# Patient Record
Sex: Female | Born: 1940 | Race: Black or African American | Hispanic: No | Marital: Single | Smoking: Current some day smoker
Health system: Southern US, Community
[De-identification: ages and names within clinical notes are randomized; demographics above are authoritative.]

## PROBLEM LIST (undated history)

## (undated) DIAGNOSIS — E039 Hypothyroidism, unspecified: Secondary | ICD-10-CM

## (undated) DIAGNOSIS — Z972 Presence of dental prosthetic device (complete) (partial): Secondary | ICD-10-CM

## (undated) DIAGNOSIS — M109 Gout, unspecified: Secondary | ICD-10-CM

## (undated) DIAGNOSIS — K219 Gastro-esophageal reflux disease without esophagitis: Secondary | ICD-10-CM

## (undated) DIAGNOSIS — E785 Hyperlipidemia, unspecified: Secondary | ICD-10-CM

## (undated) DIAGNOSIS — I10 Essential (primary) hypertension: Secondary | ICD-10-CM

## (undated) DIAGNOSIS — M1990 Unspecified osteoarthritis, unspecified site: Secondary | ICD-10-CM

## (undated) DIAGNOSIS — M75102 Unspecified rotator cuff tear or rupture of left shoulder, not specified as traumatic: Secondary | ICD-10-CM

## (undated) HISTORY — PX: CHOLECYSTECTOMY: SHX55

---

## 2004-02-19 ENCOUNTER — Other Ambulatory Visit: Payer: Self-pay

## 2005-04-13 ENCOUNTER — Ambulatory Visit: Payer: Self-pay

## 2006-03-29 ENCOUNTER — Ambulatory Visit: Payer: Self-pay

## 2006-04-04 ENCOUNTER — Emergency Department: Payer: Self-pay | Admitting: Emergency Medicine

## 2006-04-04 ENCOUNTER — Other Ambulatory Visit: Payer: Self-pay

## 2006-04-07 ENCOUNTER — Other Ambulatory Visit: Payer: Self-pay

## 2006-04-07 ENCOUNTER — Emergency Department: Payer: Self-pay | Admitting: Emergency Medicine

## 2007-05-30 ENCOUNTER — Ambulatory Visit: Payer: Self-pay

## 2008-06-25 ENCOUNTER — Ambulatory Visit: Payer: Self-pay

## 2008-09-04 ENCOUNTER — Emergency Department: Payer: Self-pay | Admitting: Emergency Medicine

## 2009-06-30 ENCOUNTER — Ambulatory Visit: Payer: Self-pay | Admitting: Internal Medicine

## 2010-01-06 ENCOUNTER — Ambulatory Visit: Payer: Self-pay | Admitting: Family Medicine

## 2011-08-10 ENCOUNTER — Ambulatory Visit: Payer: Self-pay | Admitting: Family Medicine

## 2012-08-15 ENCOUNTER — Ambulatory Visit: Payer: Self-pay | Admitting: Family Medicine

## 2012-10-21 ENCOUNTER — Emergency Department: Payer: Self-pay | Admitting: Emergency Medicine

## 2012-10-21 LAB — BASIC METABOLIC PANEL
BUN: 25 mg/dL — ABNORMAL HIGH (ref 7–18)
Calcium, Total: 9 mg/dL (ref 8.5–10.1)
Chloride: 104 mmol/L (ref 98–107)
Creatinine: 1 mg/dL (ref 0.60–1.30)
EGFR (Non-African Amer.): 57 — ABNORMAL LOW
Potassium: 3.2 mmol/L — ABNORMAL LOW (ref 3.5–5.1)
Sodium: 139 mmol/L (ref 136–145)

## 2012-10-21 LAB — TROPONIN I
Troponin-I: <0.02 ng/mL
Troponin-I: <0.02 ng/mL

## 2012-10-21 LAB — CBC
HCT: 37.7 % (ref 35.0–47.0)
HGB: 12.6 g/dL (ref 12.0–16.0)
MCH: 31.1 pg (ref 26.0–34.0)
MCHC: 33.5 g/dL (ref 32.0–36.0)
MCV: 93 fL (ref 80–100)
Platelet: 245 10*3/uL (ref 150–440)
RDW: 13.8 % (ref 11.5–14.5)

## 2013-08-16 ENCOUNTER — Ambulatory Visit: Payer: Self-pay | Admitting: Family Medicine

## 2013-12-17 ENCOUNTER — Emergency Department: Payer: Self-pay | Admitting: Emergency Medicine

## 2014-01-14 ENCOUNTER — Emergency Department: Payer: Self-pay | Admitting: Emergency Medicine

## 2014-04-25 ENCOUNTER — Ambulatory Visit: Payer: Self-pay | Admitting: Unknown Physician Specialty

## 2014-05-06 ENCOUNTER — Ambulatory Visit: Payer: Self-pay | Admitting: Family Medicine

## 2014-05-06 ENCOUNTER — Ambulatory Visit: Payer: Self-pay | Admitting: Anesthesiology

## 2014-05-09 ENCOUNTER — Ambulatory Visit: Payer: Self-pay | Admitting: Unknown Physician Specialty

## 2014-05-09 HISTORY — PX: KNEE SURGERY: SHX244

## 2014-08-29 ENCOUNTER — Ambulatory Visit: Payer: Self-pay | Admitting: Internal Medicine

## 2015-08-13 ENCOUNTER — Other Ambulatory Visit: Payer: Self-pay | Admitting: Internal Medicine

## 2015-08-13 DIAGNOSIS — Z1231 Encounter for screening mammogram for malignant neoplasm of breast: Secondary | ICD-10-CM

## 2015-08-31 ENCOUNTER — Ambulatory Visit
Admission: RE | Admit: 2015-08-31 | Discharge: 2015-08-31 | Disposition: A | Discharge status: Home / Self Care | Payer: Medicare Other | Source: Ambulatory Visit | Attending: Internal Medicine | Admitting: Internal Medicine

## 2015-08-31 ENCOUNTER — Other Ambulatory Visit: Payer: Self-pay | Admitting: Internal Medicine

## 2015-08-31 DIAGNOSIS — Z1231 Encounter for screening mammogram for malignant neoplasm of breast: Secondary | ICD-10-CM | POA: Diagnosis not present

## 2016-05-11 ENCOUNTER — Other Ambulatory Visit: Payer: Self-pay | Admitting: Unknown Physician Specialty

## 2016-05-11 ENCOUNTER — Ambulatory Visit
Admission: RE | Admit: 2016-05-11 | Discharge: 2016-05-11 | Disposition: A | Discharge status: Home / Self Care | Payer: Medicare Other | Source: Ambulatory Visit | Attending: Unknown Physician Specialty | Admitting: Unknown Physician Specialty

## 2016-05-11 DIAGNOSIS — R9082 White matter disease, unspecified: Secondary | ICD-10-CM | POA: Insufficient documentation

## 2016-05-11 DIAGNOSIS — H7092 Unspecified mastoiditis, left ear: Secondary | ICD-10-CM | POA: Insufficient documentation

## 2016-05-11 DIAGNOSIS — R51 Headache: Secondary | ICD-10-CM | POA: Insufficient documentation

## 2016-05-11 DIAGNOSIS — G8929 Other chronic pain: Secondary | ICD-10-CM

## 2016-05-11 LAB — POCT I-STAT CREATININE: Creatinine, Ser: 1.3 mg/dL — ABNORMAL HIGH (ref 0.44–1.00)

## 2016-05-11 MED ORDER — GADOBENATE DIMEGLUMINE 529 MG/ML IV SOLN
20.0000 mL | Freq: Once | INTRAVENOUS | Status: AC | PRN
  Administered 2016-05-11: 19 mL via INTRAVENOUS

## 2016-09-01 ENCOUNTER — Other Ambulatory Visit: Payer: Self-pay | Admitting: Family Medicine

## 2016-09-01 DIAGNOSIS — Z1231 Encounter for screening mammogram for malignant neoplasm of breast: Secondary | ICD-10-CM

## 2016-09-27 ENCOUNTER — Other Ambulatory Visit: Payer: Self-pay | Admitting: Family Medicine

## 2016-09-27 ENCOUNTER — Ambulatory Visit
Admission: RE | Admit: 2016-09-27 | Discharge: 2016-09-27 | Disposition: A | Discharge status: Home / Self Care | Payer: Medicare Other | Source: Ambulatory Visit | Attending: Family Medicine | Admitting: Family Medicine

## 2016-09-27 DIAGNOSIS — Z1231 Encounter for screening mammogram for malignant neoplasm of breast: Secondary | ICD-10-CM | POA: Insufficient documentation

## 2017-09-04 ENCOUNTER — Encounter: Payer: Self-pay | Admitting: *Deleted

## 2017-09-11 NOTE — Discharge Instructions (Signed)

## 2017-09-12 ENCOUNTER — Ambulatory Visit
Admission: RE | Admit: 2017-09-12 | Discharge: 2017-09-12 | Disposition: A | Discharge status: Home / Self Care | Payer: Medicare Other | Source: Ambulatory Visit | Attending: Ophthalmology | Admitting: Ophthalmology

## 2017-09-12 ENCOUNTER — Ambulatory Visit: Payer: Medicare Other | Admitting: Anesthesiology

## 2017-09-12 ENCOUNTER — Encounter
Admission: RE | Disposition: A | Discharge status: Home / Self Care | Payer: Self-pay | Source: Ambulatory Visit | Attending: Ophthalmology

## 2017-09-12 DIAGNOSIS — I10 Essential (primary) hypertension: Secondary | ICD-10-CM | POA: Insufficient documentation

## 2017-09-12 DIAGNOSIS — K219 Gastro-esophageal reflux disease without esophagitis: Secondary | ICD-10-CM | POA: Diagnosis not present

## 2017-09-12 DIAGNOSIS — Z6836 Body mass index (BMI) 36.0-36.9, adult: Secondary | ICD-10-CM | POA: Insufficient documentation

## 2017-09-12 DIAGNOSIS — H2512 Age-related nuclear cataract, left eye: Secondary | ICD-10-CM | POA: Insufficient documentation

## 2017-09-12 DIAGNOSIS — E039 Hypothyroidism, unspecified: Secondary | ICD-10-CM | POA: Insufficient documentation

## 2017-09-12 DIAGNOSIS — Z87891 Personal history of nicotine dependence: Secondary | ICD-10-CM | POA: Diagnosis not present

## 2017-09-12 HISTORY — DX: Presence of dental prosthetic device (complete) (partial): Z97.2

## 2017-09-12 HISTORY — DX: Essential (primary) hypertension: I10

## 2017-09-12 HISTORY — DX: Hyperlipidemia, unspecified: E78.5

## 2017-09-12 HISTORY — DX: Gastro-esophageal reflux disease without esophagitis: K21.9

## 2017-09-12 HISTORY — DX: Unspecified rotator cuff tear or rupture of left shoulder, not specified as traumatic: M75.102

## 2017-09-12 HISTORY — DX: Gout, unspecified: M10.9

## 2017-09-12 HISTORY — PX: CATARACT EXTRACTION W/PHACO: SHX586

## 2017-09-12 HISTORY — DX: Unspecified osteoarthritis, unspecified site: M19.90

## 2017-09-12 HISTORY — DX: Hypothyroidism, unspecified: E03.9

## 2017-09-12 SURGERY — PHACOEMULSIFICATION, CATARACT, WITH IOL INSERTION
Anesthesia: Monitor Anesthesia Care | Laterality: Left | Wound class: Clean

## 2017-09-12 MED ORDER — ACETAMINOPHEN 325 MG PO TABS: 325.0000 mg | ORAL_TABLET | ORAL | Status: DC | PRN

## 2017-09-12 MED ORDER — FENTANYL CITRATE (PF) 100 MCG/2ML IJ SOLN
INTRAMUSCULAR | Status: DC | PRN
  Administered 2017-09-12: 50 ug via INTRAVENOUS

## 2017-09-12 MED ORDER — LACTATED RINGERS IV SOLN: 10.0000 mL/h | INTRAVENOUS | Status: DC

## 2017-09-12 MED ORDER — ACETAMINOPHEN 160 MG/5ML PO SOLN: 325.0000 mg | Freq: Once | ORAL | Status: DC

## 2017-09-12 MED ORDER — LIDOCAINE HCL (PF) 2 % IJ SOLN
INTRAMUSCULAR | Status: DC | PRN
  Administered 2017-09-12: 1 mL via INTRAOCULAR

## 2017-09-12 MED ORDER — ARMC OPHTHALMIC DILATING DROPS
1.0000 "application " | OPHTHALMIC | Status: DC | PRN
  Administered 2017-09-12 (×3): 1 via OPHTHALMIC

## 2017-09-12 MED ORDER — MOXIFLOXACIN HCL 0.5 % OP SOLN
OPHTHALMIC | Status: DC | PRN
  Administered 2017-09-12: 0.2 mL via OPHTHALMIC

## 2017-09-12 MED ORDER — LACTATED RINGERS IV SOLN: INTRAVENOUS | Status: DC

## 2017-09-12 MED ORDER — EPINEPHRINE PF 1 MG/ML IJ SOLN
INTRAMUSCULAR | Status: DC | PRN
  Administered 2017-09-12: 75 mL via OPHTHALMIC

## 2017-09-12 MED ORDER — SODIUM HYALURONATE 10 MG/ML IO SOLN
INTRAOCULAR | Status: DC | PRN
  Administered 2017-09-12: 0.55 mL via INTRAOCULAR

## 2017-09-12 MED ORDER — ONDANSETRON HCL 4 MG/2ML IJ SOLN: 4.0000 mg | Freq: Once | INTRAMUSCULAR | Status: DC | PRN

## 2017-09-12 MED ORDER — ACETAMINOPHEN 325 MG PO TABS: 325.0000 mg | ORAL_TABLET | Freq: Once | ORAL | Status: DC

## 2017-09-12 MED ORDER — MIDAZOLAM HCL 2 MG/2ML IJ SOLN
INTRAMUSCULAR | Status: DC | PRN
  Administered 2017-09-12: 1.5 mg via INTRAVENOUS
  Administered 2017-09-12: 0.5 mg via INTRAVENOUS

## 2017-09-12 MED ORDER — ACETAMINOPHEN 160 MG/5ML PO SOLN: 325.0000 mg | ORAL | Status: DC | PRN

## 2017-09-12 MED ORDER — SODIUM HYALURONATE 23 MG/ML IO SOLN
INTRAOCULAR | Status: DC | PRN
  Administered 2017-09-12: 0.6 mL via INTRAOCULAR

## 2017-09-12 SURGICAL SUPPLY — 16 items

## 2017-09-12 NOTE — Op Note (Signed)
OPERATIVE NOTE  Shirley Knight [id number] 09/12/2017   PREOPERATIVE DIAGNOSIS:  Nuclear sclerotic cataract left eye.  H25.12   POSTOPERATIVE DIAGNOSIS:    Nuclear sclerotic cataract left eye.     PROCEDURE:  Phacoemusification with posterior chamber intraocular lens placement of the left eye   LENS:   Implant Name Type Inv. Item Serial No. Manufacturer Lot No. LRB No. Used  LENS IOL DIOP 22.0 - [id number] Intraocular Lens LENS IOL DIOP 22.0 [id number]   Left 1       PCB00 +22.0   ULTRASOUND TIME: 0 minutes 33 seconds.  CDE 4.28   SURGEON:  Willey BladeBradley King, MD, MPH   ANESTHESIA:  Topical with tetracaine drops augmented with 1% preservative-free intracameral lidocaine.  ESTIMATED BLOOD LOSS: <1 mL   COMPLICATIONS:  None.   DESCRIPTION OF PROCEDURE:  The patient was identified in the holding room and transported to the operating room and placed in the supine position under the operating microscope.  The left eye was identified as the operative eye and it was prepped and draped in the usual sterile ophthalmic fashion.   A 1.0 millimeter clear-corneal paracentesis was made at the 5:00 position. 0.5 ml of preservative-free 1% lidocaine with epinephrine was injected into the anterior chamber.  The anterior chamber was filled with Healon 5 viscoelastic.  A 2.4 millimeter keratome was used to make a near-clear corneal incision at the 2:00 position.  A curvilinear capsulorrhexis was made with a cystotome and capsulorrhexis forceps.  Balanced salt solution was used to hydrodissect and hydrodelineate the nucleus.   Phacoemulsification was then used in stop and chop fashion to remove the lens nucleus and epinucleus.  The remaining cortex was then removed using the irrigation and aspiration handpiece. Healon was then placed into the capsular bag to distend it for lens placement.  A lens was then injected into the capsular bag.  The remaining viscoelastic was aspirated.   Wounds were hydrated  with balanced salt solution.  The anterior chamber was inflated to a physiologic pressure with balanced salt solution.  Intracameral vigamox 0.1 mL undiltued was injected into the eye and a drop placed onto the ocular surface.  No wound leaks were noted.  The patient was taken to the recovery room in stable condition without complications of anesthesia or surgery  Willey BladeBradley King 09/12/2017, 9:19 AM

## 2017-09-12 NOTE — Anesthesia Preprocedure Evaluation (Signed)
Anesthesia Evaluation  Patient identified by MRN, date of birth, ID band Patient awake    Reviewed: Allergy & Precautions, H&P , NPO status , Patient's Chart, lab work & pertinent test results  Airway Mallampati: II  TM Distance: >3 FB Neck ROM: full    Dental no notable dental hx.    Pulmonary former smoker,    Pulmonary exam normal breath sounds clear to auscultation       Cardiovascular hypertension, Normal cardiovascular exam Rhythm:regular Rate:Normal     Neuro/Psych    GI/Hepatic GERD  ,  Endo/Other  Hypothyroidism Morbid obesity  Renal/GU      Musculoskeletal   Abdominal   Peds  Hematology   Anesthesia Other Findings   Reproductive/Obstetrics                             Anesthesia Physical Anesthesia Plan  ASA: II  Anesthesia Plan: MAC   Post-op Pain Management:    Induction:   PONV Risk Score and Plan: 2 and Midazolam  Airway Management Planned:   Additional Equipment:   Intra-op Plan:   Post-operative Plan:   Informed Consent: I have reviewed the patients History and Physical, chart, labs and discussed the procedure including the risks, benefits and alternatives for the proposed anesthesia with the patient or authorized representative who has indicated his/her understanding and acceptance.     Plan Discussed with: CRNA  Anesthesia Plan Comments:         Anesthesia Quick Evaluation

## 2017-09-12 NOTE — H&P (Signed)
The History and Physical notes are on paper, have been signed, and are to be scanned.   I have examined the patient and there are no changes to the H&P.   Willey BladeBradley Kendrea Cerritos 09/12/2017 8:43 AM

## 2017-09-12 NOTE — Transfer of Care (Signed)
Immediate Anesthesia Transfer of Care Note  Patient: Shirley Knight  Procedure(s) Performed: CATARACT EXTRACTION PHACO AND INTRAOCULAR LENS PLACEMENT (IOC)  LEFT (Left )  Patient Location: PACU  Anesthesia Type: MAC  Level of Consciousness: awake, alert  and patient cooperative  Airway and Oxygen Therapy: Patient Spontanous Breathing and Patient connected to supplemental oxygen  Post-op Assessment: Post-op Vital signs reviewed, Patient's Cardiovascular Status Stable, Respiratory Function Stable, Patent Airway and No signs of Nausea or vomiting  Post-op Vital Signs: Reviewed and stable  Complications: No apparent anesthesia complications

## 2017-09-12 NOTE — Anesthesia Postprocedure Evaluation (Signed)
Anesthesia Post Note  Patient: Shirley Knight  Procedure(s) Performed: CATARACT EXTRACTION PHACO AND INTRAOCULAR LENS PLACEMENT (IOC)  LEFT (Left )  Patient location during evaluation: PACU Anesthesia Type: MAC Level of consciousness: awake and alert and oriented Pain management: satisfactory to patient Vital Signs Assessment: post-procedure vital signs reviewed and stable Respiratory status: spontaneous breathing, nonlabored ventilation and respiratory function stable Cardiovascular status: blood pressure returned to baseline and stable Postop Assessment: Adequate PO intake and No signs of nausea or vomiting Anesthetic complications: no    Raliegh Ip

## 2017-09-12 NOTE — Anesthesia Procedure Notes (Signed)
Procedure Name: MAC Performed by: Ellenora Talton Pre-anesthesia Checklist: Patient identified, Emergency Drugs available, Suction available, Timeout performed and Patient being monitored Patient Re-evaluated:Patient Re-evaluated prior to inductionOxygen Delivery Method: Nasal cannula Placement Confirmation: positive ETCO2       

## 2017-09-13 ENCOUNTER — Encounter: Payer: Self-pay | Admitting: Ophthalmology

## 2017-09-18 ENCOUNTER — Emergency Department
Admission: EM | Admit: 2017-09-18 | Discharge: 2017-09-18 | Disposition: A | Discharge status: Home / Self Care | Payer: Medicare Other | Attending: Emergency Medicine | Admitting: Emergency Medicine

## 2017-09-18 ENCOUNTER — Encounter: Payer: Self-pay | Admitting: *Deleted

## 2017-09-18 ENCOUNTER — Emergency Department: Payer: Medicare Other

## 2017-09-18 DIAGNOSIS — Z79899 Other long term (current) drug therapy: Secondary | ICD-10-CM | POA: Diagnosis not present

## 2017-09-18 DIAGNOSIS — E039 Hypothyroidism, unspecified: Secondary | ICD-10-CM | POA: Diagnosis not present

## 2017-09-18 DIAGNOSIS — R42 Dizziness and giddiness: Secondary | ICD-10-CM | POA: Diagnosis present

## 2017-09-18 DIAGNOSIS — I10 Essential (primary) hypertension: Secondary | ICD-10-CM | POA: Diagnosis not present

## 2017-09-18 DIAGNOSIS — Z87891 Personal history of nicotine dependence: Secondary | ICD-10-CM | POA: Insufficient documentation

## 2017-09-18 LAB — URINALYSIS, COMPLETE (UACMP) WITH MICROSCOPIC
BILIRUBIN URINE: NEGATIVE
Glucose, UA: NEGATIVE mg/dL
HGB URINE DIPSTICK: NEGATIVE
KETONES UR: NEGATIVE mg/dL
Nitrite: NEGATIVE
Protein, ur: NEGATIVE mg/dL
RBC / HPF: NONE SEEN RBC/hpf (ref 0–5)
SPECIFIC GRAVITY, URINE: 1.016 (ref 1.005–1.030)
pH: 7 (ref 5.0–8.0)

## 2017-09-18 LAB — HEPATIC FUNCTION PANEL
ALT: 15 U/L (ref 14–54)
AST: 23 U/L (ref 15–41)
Albumin: 4 g/dL (ref 3.5–5.0)
Alkaline Phosphatase: 101 U/L (ref 38–126)
Total Bilirubin: 0.5 mg/dL (ref 0.3–1.2)
Total Protein: 7.8 g/dL (ref 6.5–8.1)

## 2017-09-18 LAB — BASIC METABOLIC PANEL
ANION GAP: 10 (ref 5–15)
BUN: 27 mg/dL — ABNORMAL HIGH (ref 6–20)
CHLORIDE: 106 mmol/L (ref 101–111)
CO2: 23 mmol/L (ref 22–32)
Calcium: 9.6 mg/dL (ref 8.9–10.3)
Creatinine, Ser: 1.42 mg/dL — ABNORMAL HIGH (ref 0.44–1.00)
GFR calc non Af Amer: 35 mL/min — ABNORMAL LOW (ref >60)
GFR, EST AFRICAN AMERICAN: 41 mL/min — AB (ref >60)
Glucose, Bld: 97 mg/dL (ref 65–99)
POTASSIUM: 3.7 mmol/L (ref 3.5–5.1)
SODIUM: 139 mmol/L (ref 135–145)

## 2017-09-18 LAB — CBC
HEMATOCRIT: 37.7 % (ref 35.0–47.0)
HEMOGLOBIN: 12.7 g/dL (ref 12.0–16.0)
MCH: 31.9 pg (ref 26.0–34.0)
MCHC: 33.6 g/dL (ref 32.0–36.0)
MCV: 94.8 fL (ref 80.0–100.0)
PLATELETS: 171 10*3/uL (ref 150–440)
RBC: 3.98 MIL/uL (ref 3.80–5.20)
RDW: 14.7 % — ABNORMAL HIGH (ref 11.5–14.5)
WBC: 7.8 10*3/uL (ref 3.6–11.0)

## 2017-09-18 LAB — TROPONIN I

## 2017-09-18 NOTE — Discharge Instructions (Addendum)
I think you are having a reaction on the amlodipine. For now I would cut the amlodipine in half and take half pill in the evening. I would call your doctor up near leave here and tell them that even though your now taking the medication in the evening and still feeling bad .  please have your doctor continue to monitor your renal/kidney function.

## 2017-09-18 NOTE — ED Provider Notes (Signed)
[facility] Emergency Department Provider Note   ____________________________________________   First MD Initiated Contact with Patient 09/18/17 [phone]     (approximate)  I have reviewed the triage vital signs and the nursing notes.   HISTORY  Chief Complaint Dizziness    HPI Jeanene ErbDonna W Yochim is a 76 y.o. female Who reports she was started on amlodipine about 3 weeks ago. She's been feeling weak and dizzy ever since then. She went to see her doctor on Friday and was told that she is taking it wrong she needs to take it in the evening instead of in the morning. She has been taking it in the evening since Friday but feeling even worse now. She reports nothing else is new or different she is not coughing or sneezing or having any aches or pains anywhere.  *} Past Medical History:  Diagnosis Date  . Arthritis   . GERD (gastroesophageal reflux disease)   . Gout   . Hyperlipidemia   . Hypertension   . Hypothyroidism   . Rotator cuff tear, left   . Wears dentures    partial upper    There are no active problems to display for this patient.   Past Surgical History:  Procedure Laterality Date  . CATARACT EXTRACTION W/PHACO Left 09/12/2017   Procedure: CATARACT EXTRACTION PHACO AND INTRAOCULAR LENS PLACEMENT (IOC)  LEFT;  Surgeon: Nevada CraneKing, Bradley Mark, MD;  [facility];  Service: Ophthalmology;  Laterality: Left;  . CHOLECYSTECTOMY    . KNEE SURGERY Left 05/09/2014   [facility]fred[facility]    Prior to Admission medications   Medication Sig Start Date End Date Taking? Authorizing Provider  allopurinol (ZYLOPRIM) 100 MG tablet Take 100 mg by mouth daily.    [provider]  amLODipine (NORVASC) 5 MG tablet Take 5 mg by mouth daily.    [provider]  aspirin 81 MG tablet Take 81 mg by mouth daily.    [provider]  atorvastatin (LIPITOR) 10 MG tablet Take 10 mg by mouth daily.    [provider]    CALCIUM PO Take by mouth daily.    [provider]  Cholecalciferol (VITAMIN D3) 2000 units TABS Take by mouth daily.    [provider]  Eluxadoline 100 MG TABS Take by mouth daily.    [provider]  HYDROcodone-acetaminophen (NORCO/VICODIN) 5-325 MG tablet Take 1 tablet by mouth 2 (two) times daily as needed for moderate pain.    [provider]  levothyroxine (SYNTHROID, LEVOTHROID) 88 MCG tablet Take 88 mcg by mouth daily before breakfast.    [provider]  meloxicam (MOBIC) 15 MG tablet Take 15 mg by mouth daily as needed for pain.    [provider]  metoprolol-hydrochlorothiazide (LOPRESSOR HCT) 100-25 MG tablet Take 1 tablet by mouth daily.    [provider]  Omega-3 Fatty Acids (FISH OIL) 1000 MG CPDR Take by mouth daily.    [provider]  omeprazole (PRILOSEC) 40 MG capsule Take 40 mg by mouth daily.    [provider]  traMADol (ULTRAM) 50 MG tablet Take by mouth every 6 (six) hours as needed.    [provider]    Allergies Penicillins  Family History  Problem Relation Age of Onset  . Breast cancer Mother 1950    Social History Social History  Substance Use Topics  . Smoking status: Former Smoker    Quit date: 08/2016  . Smokeless  tobacco: Never Used  . Alcohol use 1.8 oz/week    3 Shots of liquor per week    Review of Systems  Constitutional: No fever/chills Eyes: No visual changes. ENT: No sore throat. Cardiovascular: Denies chest pain. Respiratory: Denies shortness of breath. Gastrointestinal: No abdominal pain.  No nausea, no vomiting.  No diarrhea.  No constipation. Genitourinary: Negative for dysuria. Musculoskeletal: Negative for back pain. Skin: Negative for rash. Neurological: Negative for headaches, focal weakness  ____________________________________________   PHYSICAL EXAM:  VITAL SIGNS: ED Triage Vitals [09/18/17 0504]  Enc Vitals Group     BP (!)  144/76     Pulse Rate 69     Resp 20     Temp 98.7 F (37.1 C)     Temp Source Oral     SpO2 100 %     Weight      Height      Head Circumference      Peak Flow      Pain Score      Pain Loc      Pain Edu?      Excl. in GC?     Constitutional: Alert and oriented. Well appearing and in no acute distress. Eyes: Conjunctivae are normal. PERRL. EOMI. Head: Atraumatic. Nose: No congestion/rhinnorhea. Mouth/Throat: Mucous membranes are moist.  Oropharynx non-erythematous. Neck: No stridor. Cardiovascular: Normal rate, regular rhythm. Grossly normal heart sounds.  Good peripheral circulation. Respiratory: Normal respiratory effort.  No retractions. Lungs CTAB. Gastrointestinal: Soft and nontender. No distention. No abdominal bruits. No CVA tenderness. Musculoskeletal: No lower extremity tenderness nor edema.  No joint effusions. Neurologic:  Normal speech and language. No gross focal neurologic deficits are appreciated. specifically cranial nerves II through XII are intact of the visual fields were not checked vision cerebellar finger-nose rapid alternating movements and hands are normal motor strength is 5 over 5 and sensation is intact throughout Skin:  Skin is warm, dry and intact. No rash noted. Psychiatric: Mood and affect are normal. Speech and behavior are normal.  ____________________________________________   LABS (all labs ordered are listed, but only abnormal results are displayed)  Labs Reviewed  BASIC METABOLIC PANEL - Abnormal; Notable for the following:       Result Value   BUN 27 (*)    Creatinine, Ser 1.42 (*)    GFR calc non Af Amer 35 (*)    GFR calc Af Amer 41 (*)    All other components within normal limits  CBC - Abnormal; Notable for the following:    RDW 14.7 (*)    All other components within normal limits  URINALYSIS, COMPLETE (UACMP) WITH MICROSCOPIC - Abnormal; Notable for the following:    Color, Urine YELLOW (*)    APPearance CLEAR (*)     Leukocytes, UA TRACE (*)    Bacteria, UA RARE (*)    Squamous Epithelial / LPF 0-5 (*)    All other components within normal limits  HEPATIC FUNCTION PANEL - Abnormal; Notable for the following:    Bilirubin, Direct <0.1 (*)    All other components within normal limits  TROPONIN I   ____________________________________________  EKG  EKG read and interpreted by me shows normal sinus rhythm rate of 63 left axis decreased R-wave progression ____________________________________________  RADIOLOGY  chest x-ray shows no acute disease ____________________________________________   PROCEDURES  Procedure(s) performed:   Procedures  Critical Care performed:   ____________________________________________   INITIAL IMPRESSION / ASSESSMENT AND PLAN / ED COURSE  As part  of my medical decision making, I reviewed the following data within the electronic MEDICAL RECORD NUMBER records were reviewed patient's GFR is just slightly lower than it was last time. She says she is not feeling really hungry and not eating as much because she feels bad again this is all started immediately after she started the new medication her electrolytes are otherwise essentially normal her white blood count was normal troponins negative chest x-ray looks good. Exam is also normal history sounds most consistent with a reaction to the amlodipine. I will have her follow-up with her regular doctor. Her doctor will have to watch her renal function      ____________________________________________   FINAL CLINICAL IMPRESSION(S) / ED DIAGNOSES  Final diagnoses:  Dizziness      NEW MEDICATIONS STARTED DURING THIS VISIT:  New Prescriptions   No medications on file     Note:  This document was prepared using Dragon voice recognition software and may include unintentional dictation errors.    Arnaldo Natal, MD 09/18/17 1115

## 2017-09-18 NOTE — ED Notes (Signed)
AAOx3.  Skin warm and dry.  NAD 

## 2017-09-18 NOTE — ED Triage Notes (Addendum)
Patient reports recent change in blood pressure medication added a medication in the evening along with usual blood pressure medication..  States taking metoprolol with hctz in the mornings (old medicine) and added norvasc in the evenings (new)  Reports having dizziness and "feeling terrible".

## 2017-10-02 NOTE — Discharge Instructions (Signed)

## 2017-10-03 ENCOUNTER — Encounter
Admission: RE | Disposition: A | Discharge status: Home / Self Care | Payer: Self-pay | Source: Ambulatory Visit | Attending: Ophthalmology

## 2017-10-03 ENCOUNTER — Ambulatory Visit: Payer: Medicare Other | Admitting: Anesthesiology

## 2017-10-03 ENCOUNTER — Ambulatory Visit
Admission: RE | Admit: 2017-10-03 | Discharge: 2017-10-03 | Disposition: A | Discharge status: Home / Self Care | Payer: Medicare Other | Source: Ambulatory Visit | Attending: Ophthalmology | Admitting: Ophthalmology

## 2017-10-03 DIAGNOSIS — Z87891 Personal history of nicotine dependence: Secondary | ICD-10-CM | POA: Insufficient documentation

## 2017-10-03 DIAGNOSIS — I10 Essential (primary) hypertension: Secondary | ICD-10-CM | POA: Diagnosis not present

## 2017-10-03 DIAGNOSIS — H2511 Age-related nuclear cataract, right eye: Secondary | ICD-10-CM | POA: Diagnosis present

## 2017-10-03 DIAGNOSIS — K219 Gastro-esophageal reflux disease without esophagitis: Secondary | ICD-10-CM | POA: Diagnosis not present

## 2017-10-03 HISTORY — PX: CATARACT EXTRACTION W/PHACO: SHX586

## 2017-10-03 SURGERY — PHACOEMULSIFICATION, CATARACT, WITH IOL INSERTION
Anesthesia: Monitor Anesthesia Care | Laterality: Right | Wound class: Clean

## 2017-10-03 MED ORDER — MEPERIDINE HCL 25 MG/ML IJ SOLN: 6.2500 mg | INTRAMUSCULAR | Status: DC | PRN

## 2017-10-03 MED ORDER — ARMC OPHTHALMIC DILATING DROPS
1.0000 "application " | OPHTHALMIC | Status: DC | PRN
  Administered 2017-10-03 (×3): 1 via OPHTHALMIC

## 2017-10-03 MED ORDER — FENTANYL CITRATE (PF) 100 MCG/2ML IJ SOLN: 25.0000 ug | INTRAMUSCULAR | Status: DC | PRN

## 2017-10-03 MED ORDER — SODIUM HYALURONATE 10 MG/ML IO SOLN
INTRAOCULAR | Status: DC | PRN
  Administered 2017-10-03: 0.85 mL via INTRAOCULAR

## 2017-10-03 MED ORDER — OXYCODONE HCL 5 MG/5ML PO SOLN: 5.0000 mg | Freq: Once | ORAL | Status: DC | PRN

## 2017-10-03 MED ORDER — LACTATED RINGERS IV SOLN: 10.0000 mL/h | INTRAVENOUS | Status: DC

## 2017-10-03 MED ORDER — EPINEPHRINE PF 1 MG/ML IJ SOLN
INTRAOCULAR | Status: DC | PRN
  Administered 2017-10-03: 88 mL via OPHTHALMIC

## 2017-10-03 MED ORDER — MIDAZOLAM HCL 2 MG/2ML IJ SOLN
INTRAMUSCULAR | Status: DC | PRN
  Administered 2017-10-03: 2 mg via INTRAVENOUS

## 2017-10-03 MED ORDER — OXYCODONE HCL 5 MG PO TABS: 5.0000 mg | ORAL_TABLET | Freq: Once | ORAL | Status: DC | PRN

## 2017-10-03 MED ORDER — MOXIFLOXACIN HCL 0.5 % OP SOLN
OPHTHALMIC | Status: DC | PRN
  Administered 2017-10-03: 1 [drp] via OPHTHALMIC

## 2017-10-03 MED ORDER — FENTANYL CITRATE (PF) 100 MCG/2ML IJ SOLN
INTRAMUSCULAR | Status: DC | PRN
  Administered 2017-10-03: 50 ug via INTRAVENOUS

## 2017-10-03 MED ORDER — PROMETHAZINE HCL 25 MG/ML IJ SOLN: 6.2500 mg | INTRAMUSCULAR | Status: DC | PRN

## 2017-10-03 MED ORDER — LIDOCAINE HCL (PF) 2 % IJ SOLN
INTRAMUSCULAR | Status: DC | PRN
  Administered 2017-10-03: 1 mL via INTRAOCULAR

## 2017-10-03 MED ORDER — SODIUM HYALURONATE 23 MG/ML IO SOLN
INTRAOCULAR | Status: DC | PRN
  Administered 2017-10-03: 0.6 mL via INTRAOCULAR

## 2017-10-03 SURGICAL SUPPLY — 16 items
CANNULA ANT/CHMB 27GA (MISCELLANEOUS) ×3 IMPLANT
DISSECTOR HYDRO NUCLEUS 50X22 (MISCELLANEOUS) ×3 IMPLANT
GLOVE BIO SURGEON STRL SZ8 (GLOVE) ×3 IMPLANT
GLOVE SURG LX 7.5 STRW (GLOVE) ×2
GLOVE SURG LX STRL 7.5 STRW (GLOVE) ×1 IMPLANT
GOWN STRL REUS W/ TWL LRG LVL3 (GOWN DISPOSABLE) ×2 IMPLANT
GOWN STRL REUS W/TWL LRG LVL3 (GOWN DISPOSABLE) ×4
LENS IOL TECNIS ITEC 22.5 (Intraocular Lens) ×3 IMPLANT
MARKER SKIN DUAL TIP RULER LAB (MISCELLANEOUS) ×3 IMPLANT
PACK CATARACT (MISCELLANEOUS) ×3 IMPLANT
PACK DR. KING ARMS (PACKS) ×3 IMPLANT
PACK EYE AFTER SURG (MISCELLANEOUS) ×3 IMPLANT
SYR 3ML LL SCALE MARK (SYRINGE) ×3 IMPLANT
SYR TB 1ML LUER SLIP (SYRINGE) ×3 IMPLANT
WATER STERILE IRR 500ML POUR (IV SOLUTION) ×3 IMPLANT
WIPE NON LINTING 3.25X3.25 (MISCELLANEOUS) ×3 IMPLANT

## 2017-10-03 NOTE — Anesthesia Preprocedure Evaluation (Signed)
Anesthesia Evaluation  Patient identified by MRN, date of birth, ID band Patient awake    Reviewed: Allergy & Precautions, H&P , NPO status , Patient's Chart, lab work & pertinent test results  Airway Mallampati: II  TM Distance: >3 FB Neck ROM: full    Dental no notable dental hx.    Pulmonary neg pulmonary ROS, former smoker,    Pulmonary exam normal breath sounds clear to auscultation       Cardiovascular hypertension, negative cardio ROS Normal cardiovascular exam Rhythm:regular Rate:Normal     Neuro/Psych negative neurological ROS  negative psych ROS   GI/Hepatic negative GI ROS, Neg liver ROS, GERD  ,  Endo/Other  negative endocrine ROSHypothyroidism Morbid obesity  Renal/GU negative Renal ROS  negative genitourinary   Musculoskeletal negative musculoskeletal ROS (+)   Abdominal   Peds negative pediatric ROS (+)  Hematology negative hematology ROS (+)   Anesthesia Other Findings   Reproductive/Obstetrics negative OB ROS                             Anesthesia Physical  Anesthesia Plan  ASA: II  Anesthesia Plan: MAC   Post-op Pain Management:    Induction:   PONV Risk Score and Plan: 2 and Midazolam  Airway Management Planned:   Additional Equipment:   Intra-op Plan:   Post-operative Plan:   Informed Consent: I have reviewed the patients History and Physical, chart, labs and discussed the procedure including the risks, benefits and alternatives for the proposed anesthesia with the patient or authorized representative who has indicated his/her understanding and acceptance.     Plan Discussed with: CRNA  Anesthesia Plan Comments:         Anesthesia Quick Evaluation

## 2017-10-03 NOTE — Op Note (Signed)
OPERATIVE NOTE  Shirley Knight [id number] 10/03/2017   PREOPERATIVE DIAGNOSIS:  Nuclear sclerotic cataract right eye.  H25.11   POSTOPERATIVE DIAGNOSIS:    Nuclear sclerotic cataract right eye.     PROCEDURE:  Phacoemusification with posterior chamber intraocular lens placement of the right eye   LENS:   Implant Name Type Inv. Item Serial No. Manufacturer Lot No. LRB No. Used  LENS IOL DIOP 22.5 - [id number] Intraocular Lens LENS IOL DIOP 22.5 [id number] AMO  Right 1       PCB00 +22.5   ULTRASOUND TIME: 0 minutes 50 seconds.  CDE 4.11   SURGEON:  Willey BladeBradley Ethelwyn Gilbertson, MD, MPH  ANESTHESIOLOGIST: Anesthesiologist: Aldine ContesScouras, Nicole Elaine, MD CRNA: Andee PolesBush, Wendy, CRNA   ANESTHESIA:  Topical with tetracaine drops augmented with 1% preservative-free intracameral lidocaine.  ESTIMATED BLOOD LOSS: less than 1 mL.   COMPLICATIONS:  None.   DESCRIPTION OF PROCEDURE:  The patient was identified in the holding room and transported to the operating room and placed in the supine position under the operating microscope.  The right eye was identified as the operative eye and it was prepped and draped in the usual sterile ophthalmic fashion.   A 1.0 millimeter clear-corneal paracentesis was made at the 10:30 position. 0.5 ml of preservative-free 1% lidocaine with epinephrine was injected into the anterior chamber.  The anterior chamber was filled with Healon 5 viscoelastic.  A 2.4 millimeter keratome was used to make a near-clear corneal incision at the 8:00 position.  A curvilinear capsulorrhexis was made with a cystotome and capsulorrhexis forceps.  Balanced salt solution was used to hydrodissect and hydrodelineate the nucleus.   Phacoemulsification was then used in stop and chop fashion to remove the lens nucleus and epinucleus.  The remaining cortex was then removed using the irrigation and aspiration handpiece. Healon was then placed into the capsular bag to distend it for lens placement.  A lens  was then injected into the capsular bag.  The remaining viscoelastic was aspirated.   Wounds were hydrated with balanced salt solution.  The anterior chamber was inflated to a physiologic pressure with balanced salt solution.   The lens was relatively soft, and a portion of the epinucleus was removed with the I/A.  Intracameral vigamox 0.1 mL undiluted was injected into the eye and a drop placed onto the ocular surface.  No wound leaks were noted.  The patient was taken to the recovery room in stable condition without complications of anesthesia or surgery  Willey BladeBradley Othella Slappey 10/03/2017, 7:58 AM

## 2017-10-03 NOTE — Anesthesia Procedure Notes (Signed)
Procedure Name: MAC Performed by: Ahilyn Nell, CRNA Pre-anesthesia Checklist: Patient identified, Emergency Drugs available, Suction available, Timeout performed and Patient being monitored Patient Re-evaluated:Patient Re-evaluated prior to induction Oxygen Delivery Method: Nasal cannula Placement Confirmation: positive ETCO2       

## 2017-10-03 NOTE — Anesthesia Postprocedure Evaluation (Signed)
Anesthesia Post Note  Patient: Shirley Knight  Procedure(s) Performed: CATARACT EXTRACTION PHACO AND INTRAOCULAR LENS PLACEMENT (IOC)  RIGHT (Right )  Patient location during evaluation: PACU Anesthesia Type: MAC Level of consciousness: awake and alert Pain management: pain level controlled Vital Signs Assessment: post-procedure vital signs reviewed and stable Respiratory status: spontaneous breathing, nonlabored ventilation, respiratory function stable and patient connected to nasal cannula oxygen Cardiovascular status: stable and blood pressure returned to baseline Postop Assessment: no apparent nausea or vomiting Anesthetic complications: no    [name] ELAINE

## 2017-10-03 NOTE — H&P (Signed)
The History and Physical notes are on paper, have been signed, and are to be scanned.   I have examined the patient and there are no changes to the H&P.   Willey BladeBradley Alania Overholt 10/03/2017 7:18 AM

## 2017-10-03 NOTE — Transfer of Care (Signed)
Immediate Anesthesia Transfer of Care Note  Patient: Shirley Knight  Procedure(s) Performed: CATARACT EXTRACTION PHACO AND INTRAOCULAR LENS PLACEMENT (IOC)  RIGHT (Right )  Patient Location: PACU  Anesthesia Type: MAC  Level of Consciousness: awake, alert  and patient cooperative  Airway and Oxygen Therapy: Patient Spontanous Breathing and Patient connected to supplemental oxygen  Post-op Assessment: Post-op Vital signs reviewed, Patient's Cardiovascular Status Stable, Respiratory Function Stable, Patent Airway and No signs of Nausea or vomiting  Post-op Vital Signs: Reviewed and stable  Complications: No apparent anesthesia complications

## 2017-10-04 ENCOUNTER — Encounter: Payer: Self-pay | Admitting: Ophthalmology

## 2017-10-25 ENCOUNTER — Other Ambulatory Visit: Payer: Self-pay | Admitting: Family Medicine

## 2017-10-25 DIAGNOSIS — Z1231 Encounter for screening mammogram for malignant neoplasm of breast: Secondary | ICD-10-CM

## 2017-12-07 ENCOUNTER — Ambulatory Visit
Admission: RE | Admit: 2017-12-07 | Discharge: 2017-12-07 | Disposition: A | Discharge status: Home / Self Care | Payer: Medicare Other | Source: Ambulatory Visit | Attending: Family Medicine | Admitting: Family Medicine

## 2017-12-07 DIAGNOSIS — Z1231 Encounter for screening mammogram for malignant neoplasm of breast: Secondary | ICD-10-CM

## 2018-01-10 ENCOUNTER — Ambulatory Visit: Payer: Medicare Other | Attending: Internal Medicine

## 2018-01-10 DIAGNOSIS — G4733 Obstructive sleep apnea (adult) (pediatric): Secondary | ICD-10-CM | POA: Insufficient documentation

## 2018-04-17 ENCOUNTER — Emergency Department: Payer: Medicare Other

## 2018-04-17 ENCOUNTER — Emergency Department
Admission: EM | Admit: 2018-04-17 | Discharge: 2018-04-17 | Disposition: A | Discharge status: Home / Self Care | Payer: Medicare Other | Attending: Student in an Organized Health Care Education/Training Program | Admitting: Student in an Organized Health Care Education/Training Program

## 2018-04-17 ENCOUNTER — Encounter: Payer: Self-pay | Admitting: Emergency Medicine

## 2018-04-17 DIAGNOSIS — M7541 Impingement syndrome of right shoulder: Secondary | ICD-10-CM | POA: Insufficient documentation

## 2018-04-17 DIAGNOSIS — Z79899 Other long term (current) drug therapy: Secondary | ICD-10-CM | POA: Diagnosis not present

## 2018-04-17 DIAGNOSIS — Z87891 Personal history of nicotine dependence: Secondary | ICD-10-CM | POA: Diagnosis not present

## 2018-04-17 DIAGNOSIS — E039 Hypothyroidism, unspecified: Secondary | ICD-10-CM | POA: Diagnosis not present

## 2018-04-17 DIAGNOSIS — Z7982 Long term (current) use of aspirin: Secondary | ICD-10-CM | POA: Insufficient documentation

## 2018-04-17 DIAGNOSIS — I10 Essential (primary) hypertension: Secondary | ICD-10-CM | POA: Diagnosis not present

## 2018-04-17 DIAGNOSIS — M25561 Pain in right knee: Secondary | ICD-10-CM | POA: Diagnosis present

## 2018-04-17 DIAGNOSIS — M1711 Unilateral primary osteoarthritis, right knee: Secondary | ICD-10-CM | POA: Diagnosis not present

## 2018-04-17 MED ORDER — MELOXICAM 7.5 MG PO TABS: 7.5000 mg | ORAL_TABLET | Freq: Every day | ORAL | 0 refills | Status: DC

## 2018-04-17 NOTE — ED Provider Notes (Signed)
[facility] Emergency Department Provider Note   ____________________________________________   First MD Initiated Contact with Patient 04/17/18 1217     (approximate)  I have reviewed the triage vital signs and the nursing notes.   HISTORY  Chief Complaint Shoulder Pain and Knee Pain    HPI Shirley Knight is a 77 y.o. female patient complain increasing non-provocative right shoulder pain.  Patient was evaluated last year and diagnosed osteoarthritis of the [facility] joint.  Patient also complaining of right knee pain.  There is a history of a medial meniscus tear of the posterior horn of the right knee.  Diagnosis done with MRI 2015.  Patient stated both complaints were alleviated with physical therapy in the past.  Patient describes her pain is "aching".  No recent palliative measures for complaint.  Patient is a history of gout but review of labs shows no increase uric acid levels.  Past Medical History:  Diagnosis Date  . Arthritis   . GERD (gastroesophageal reflux disease)   . Gout   . Hyperlipidemia   . Hypertension   . Hypothyroidism   . Rotator cuff tear, left   . Wears dentures    partial upper    There are no active problems to display for this patient.   Past Surgical History:  Procedure Laterality Date  . CATARACT EXTRACTION W/PHACO Left 09/12/2017   Procedure: CATARACT EXTRACTION PHACO AND INTRAOCULAR LENS PLACEMENT (IOC)  LEFT;  Surgeon: Nevada Crane, MD;  Location: [facility];  Service: Ophthalmology;  Laterality: Left;  . CATARACT EXTRACTION W/PHACO Right 10/03/2017   Procedure: CATARACT EXTRACTION PHACO AND INTRAOCULAR LENS PLACEMENT (IOC)  RIGHT;  Surgeon: Nevada Crane, MD;  Location: [facility];  Service: Ophthalmology;  Laterality: Right;  . CHOLECYSTECTOMY    . KNEE SURGERY Left 05/09/2014   [facility] - Dr. Elfredia Nevins    Prior to Admission medications   Medication Sig Start Date End Date Taking?  Authorizing Provider  allopurinol (ZYLOPRIM) 100 MG tablet Take 100 mg by mouth daily.    [provider]  amLODipine (NORVASC) 5 MG tablet Take 5 mg by mouth daily.    [provider]  aspirin 81 MG tablet Take 81 mg by mouth daily.    [provider]  atorvastatin (LIPITOR) 10 MG tablet Take 10 mg by mouth daily.    [provider]  CALCIUM PO Take by mouth daily.    [provider]  Cholecalciferol (VITAMIN D3) 2000 units TABS Take by mouth daily.    [provider]  Eluxadoline 100 MG TABS Take by mouth daily.    [provider]  HYDROcodone-acetaminophen (NORCO/VICODIN) 5-325 MG tablet Take 1 tablet by mouth 2 (two) times daily as needed for moderate pain.    [provider]  levothyroxine (SYNTHROID, LEVOTHROID) 88 MCG tablet Take 88 mcg by mouth daily before breakfast.    [provider]  meloxicam (MOBIC) 15 MG tablet Take 15 mg by mouth daily as needed for pain.    [provider]  meloxicam (MOBIC) 7.5 MG tablet Take 1 tablet (7.5 mg total) by mouth daily. 04/17/18   Joni Reining, PA-C  metoprolol-hydrochlorothiazide (LOPRESSOR HCT) 100-25 MG tablet Take 1 tablet by mouth daily.    [provider]  Omega-3 Fatty Acids (FISH OIL) 1000 MG CPDR Take by mouth daily.    [provider]  omeprazole (PRILOSEC) 40 MG capsule Take 40 mg by mouth daily.  [provider]  traMADol (ULTRAM) 50 MG tablet Take by mouth every 6 (six) hours as needed.    [provider]    Allergies Penicillins  Family History  Problem Relation Age of Onset  . Breast cancer Mother 19    Social History Social History   Tobacco Use  . Smoking status: Former Smoker    Last attempt to quit: 08/2016    Years since quitting: 1.6  . Smokeless tobacco: Never Used  Substance Use Topics  . Alcohol use: Yes    Alcohol/week: 1.8 oz    Types: 3 Shots of liquor per week  . Drug use: Not  on file    Review of Systems Constitutional: No fever/chills Eyes: No visual changes. ENT: No sore throat. Cardiovascular: Denies chest pain. Respiratory: Denies shortness of breath. Gastrointestinal: No abdominal pain.  No nausea, no vomiting.  No diarrhea.  No constipation. Genitourinary: Negative for dysuria. Musculoskeletal: Right shoulder right knee pain.   Skin: Negative for rash. Neurological: Negative for headaches, focal weakness or numbness. Endocrine:Hyperlipidemia, hypertension, and hypothyroidism. Hematological/Lymphatic: Allergic/Immunilogical: **}  ____________________________________________   PHYSICAL EXAM:  VITAL SIGNS: ED Triage Vitals  Enc Vitals Group     BP 04/17/18 1201 (!) 152/88     Pulse Rate 04/17/18 1201 (!) 55     Resp 04/17/18 1201 16     Temp 04/17/18 1201 98.8 F (37.1 C)     Temp Source 04/17/18 1201 Oral     SpO2 04/17/18 1201 94 %     Weight 04/17/18 1202 195 lb (88.5 kg)     Height 04/17/18 1202 5' 2.5" (1.588 m)     Head Circumference --      Peak Flow --      Pain Score 04/17/18 1202 0     Pain Loc --      Pain Edu? --      Excl. in GC? --    Constitutional: Alert and oriented. Well appearing and in no acute distress. Neck: No stridor.   Hematological/Lymphatic/Immunilogical: No cervical lymphadenopathy. Cardiovascular: Normal rate, regular rhythm. Grossly normal heart sounds.  Good peripheral circulation. Respiratory: Normal respiratory effort.  No retractions. Lungs CTAB. Musculoskeletal: No obvious deformity of the right shoulder or knee. Neurologic:  Normal speech and language. No gross focal neurologic deficits are appreciated. No gait instability. Skin:  Skin is warm, dry and intact. No rash noted. Psychiatric: Mood and affect are normal. Speech and behavior are normal.  ____________________________________________   LABS (all labs ordered are listed, but only abnormal results are displayed)  Labs Reviewed - No  data to display ____________________________________________  EKG   ____________________________________________  RADIOLOGY    Official radiology report(s): Dg Shoulder Right  Result Date: 04/17/2018 CLINICAL DATA:  Right shoulder pain. EXAM: RIGHT SHOULDER - 2+ VIEW COMPARISON:  None. FINDINGS: There is no evidence of fracture or dislocation. There are advanced degenerative changes involving the [facility] joint with decrease in acromiohumeral distance and degenerative disease involving the humeral head. Findings may be consistent with impingement and underlying rotator cuff pathology. Soft tissues are unremarkable. IMPRESSION: No acute fracture. Degenerative disease of the [facility] joint with associated decrease in acromiohumeral distance may be consistent with impingement and underlying rotator cuff pathology. Electronically Signed   By: Irish Lack M.D.   On: 04/17/2018 13:21   Dg Knee Complete 4 Views Right  Result Date: 04/17/2018 CLINICAL DATA:  Right knee pain for 3 weeks.  No recent injury. EXAM: RIGHT KNEE - COMPLETE 4+  VIEW COMPARISON:  None. FINDINGS: Mild tricompartment compartment degenerative change. No acute bony abnormality. No evidence of fracture. No evidence of dislocation. IMPRESSION: Mild tricompartment compartment degenerative change. No acute bony abnormality. Electronically Signed   By: Maisie Fus  Register   On: 04/17/2018 13:21    ____________________________________________   PROCEDURES  Procedure(s) performed: None  Procedures  Critical Care performed: No  ____________________________________________   INITIAL IMPRESSION / ASSESSMENT AND PLAN / ED COURSE  As part of my medical decision making, I reviewed the following data within the electronic MEDICAL RECORD NUMBER    Right shoulder pain secondary to impingement syndrome.  Right knee pain secondary to osteoarthritis.  Discussed x-ray findings with patient.  Patient referred to orthopedic for definitive evaluation  and treatment.      ____________________________________________   FINAL CLINICAL IMPRESSION(S) / ED DIAGNOSES  Final diagnoses:  Impingement syndrome of right shoulder  Primary osteoarthritis of right knee     ED Discharge Orders        Ordered    meloxicam (MOBIC) 7.5 MG tablet  Daily     04/17/18 1346       Note:  This document was prepared using Dragon voice recognition software and may include unintentional dictation errors.    Joni Reining, PA-C 04/17/18 1347    Willy Eddy, MD 04/17/18 [phone]

## 2018-04-17 NOTE — ED Notes (Addendum)
See triage note  Presents with pain to right knee and shoulder  States pain started about 3 weeks ago  Denies any injury  Hx of arthritis

## 2018-04-17 NOTE — ED Triage Notes (Signed)
Patient is complaining of right knee pain and right shoulder pain x 3 weeks.  Patient states, "I think it's old age or something."  Patient denies any recent injury.

## 2018-10-29 ENCOUNTER — Other Ambulatory Visit: Payer: Self-pay | Admitting: Family Medicine

## 2018-10-29 DIAGNOSIS — Z1231 Encounter for screening mammogram for malignant neoplasm of breast: Secondary | ICD-10-CM

## 2018-12-25 ENCOUNTER — Ambulatory Visit
Admission: RE | Admit: 2018-12-25 | Discharge: 2018-12-25 | Disposition: A | Discharge status: Home / Self Care | Payer: Medicare Other | Source: Ambulatory Visit | Attending: Family Medicine | Admitting: Family Medicine

## 2018-12-25 DIAGNOSIS — Z1231 Encounter for screening mammogram for malignant neoplasm of breast: Secondary | ICD-10-CM | POA: Insufficient documentation

## 2019-05-24 IMAGING — MG DIGITAL SCREENING BILATERAL MAMMOGRAM WITH TOMO AND CAD
8 series · 8 of 24 positions shown · non-contrast
Comparison: Previous exam(s).

CLINICAL DATA: Screening.

EXAM:
DIGITAL SCREENING BILATERAL MAMMOGRAM WITH TOMO AND CAD

[R CC synth-2D]
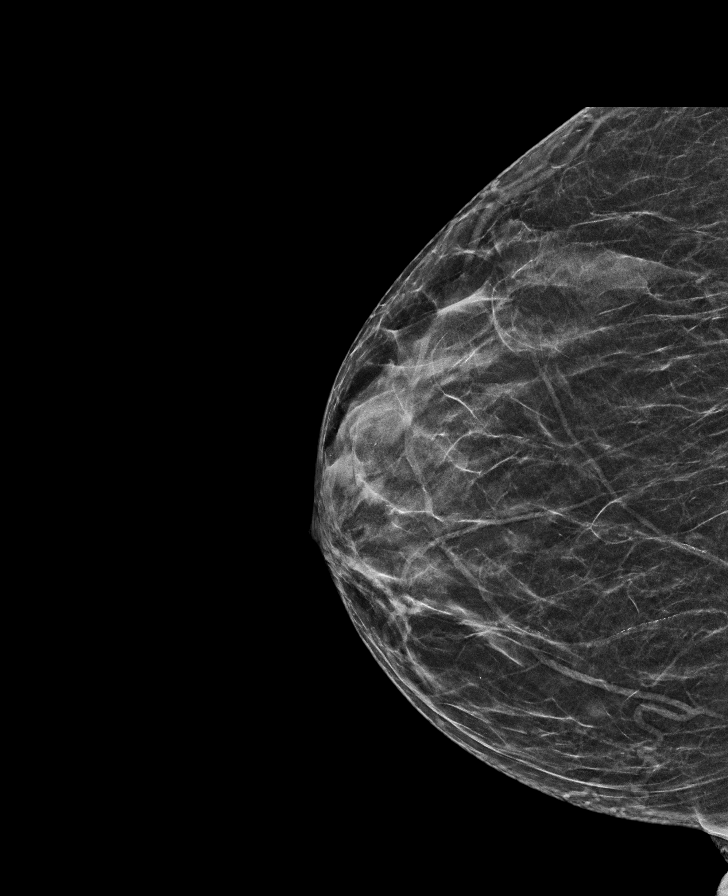

[L CC synth-2D]
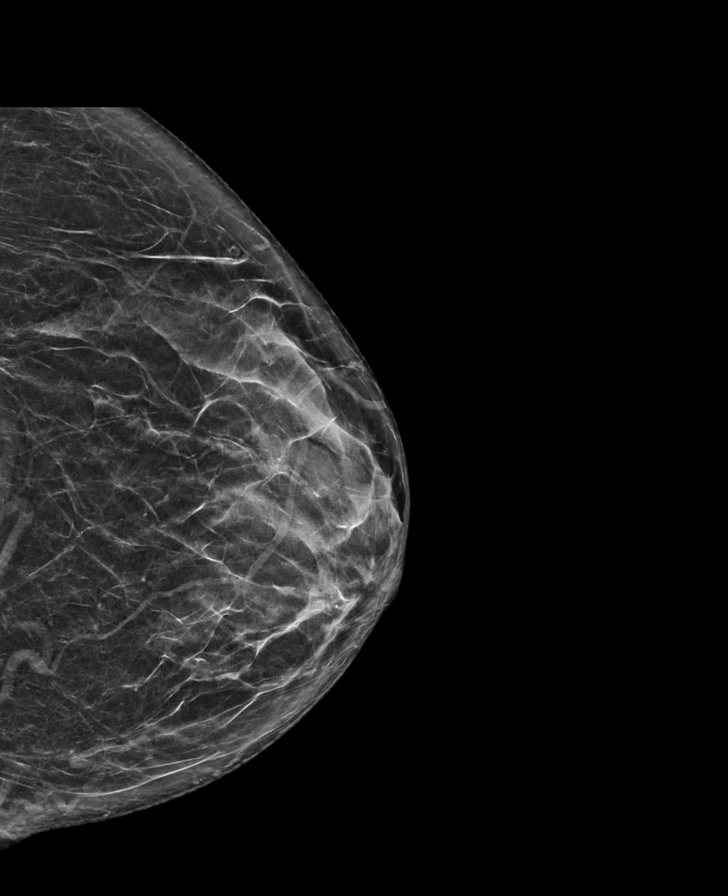

[L MLO synth-2D]
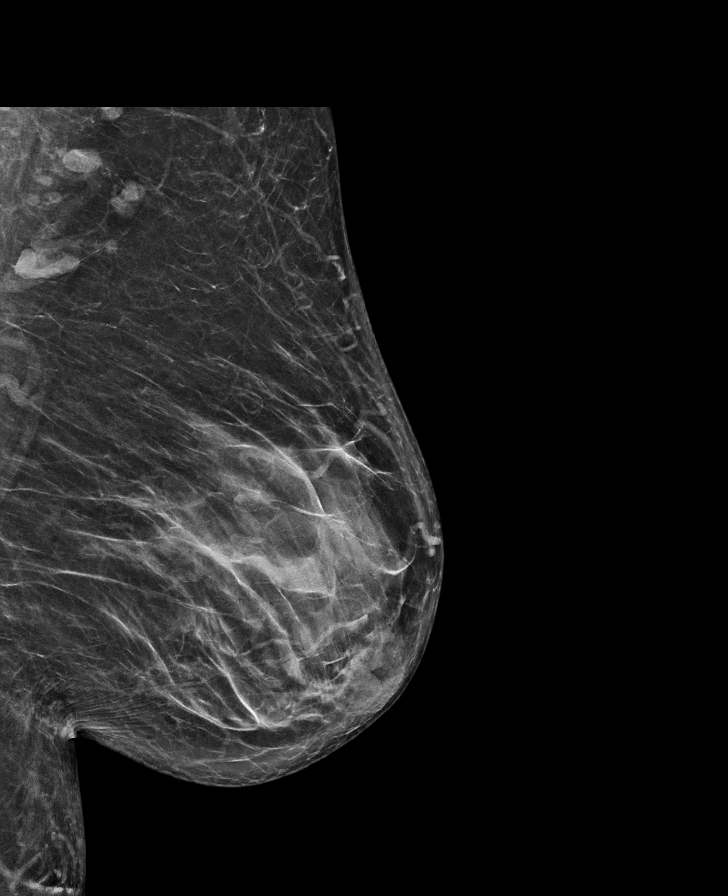

[R MLO synth-2D]
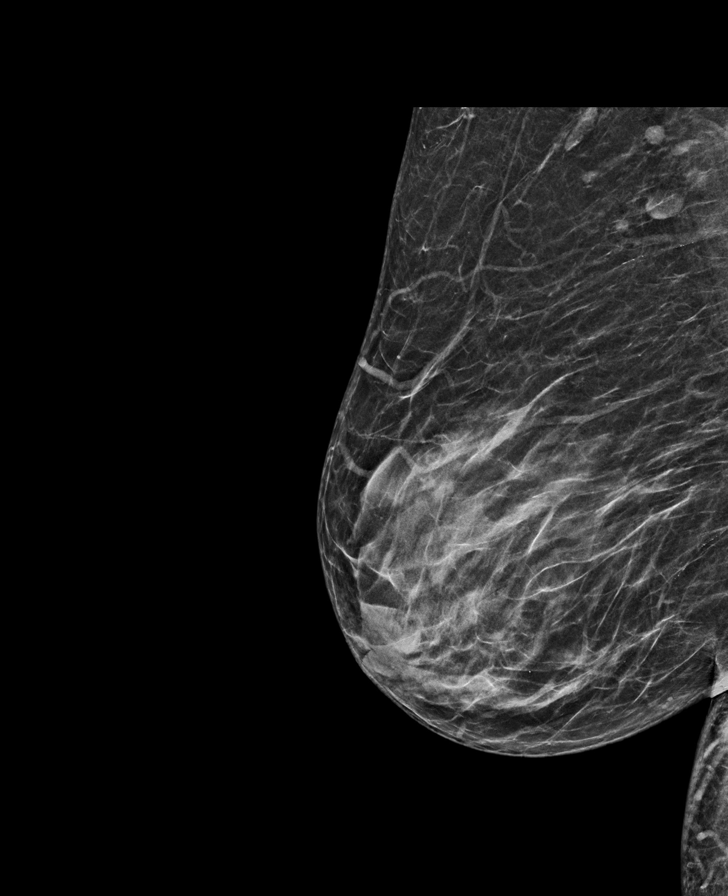

[L MLO tomo · tomo slice 36/71.0]
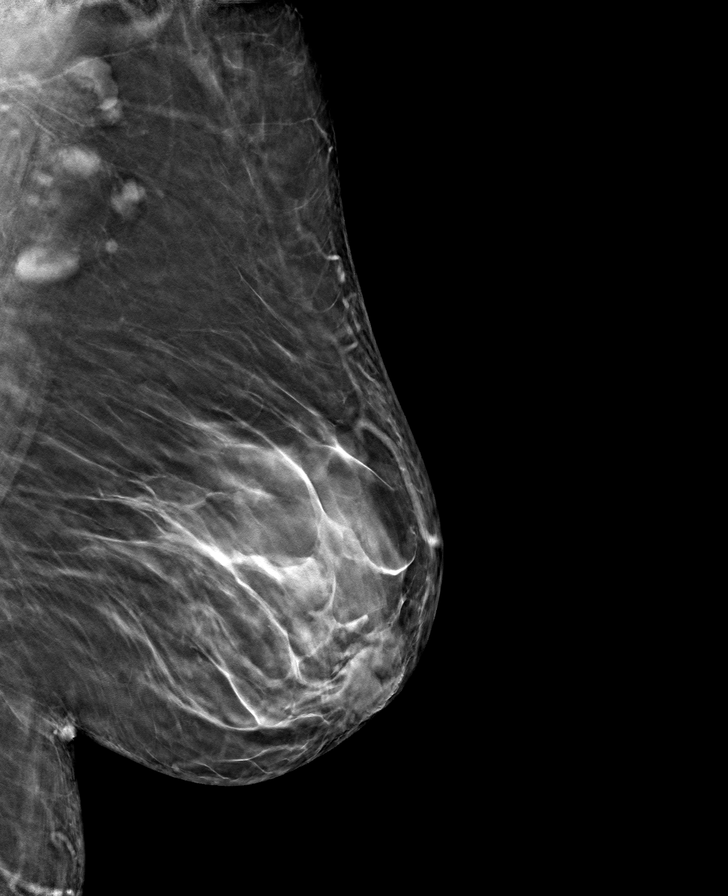

[R MLO tomo · tomo slice 25/50.0]
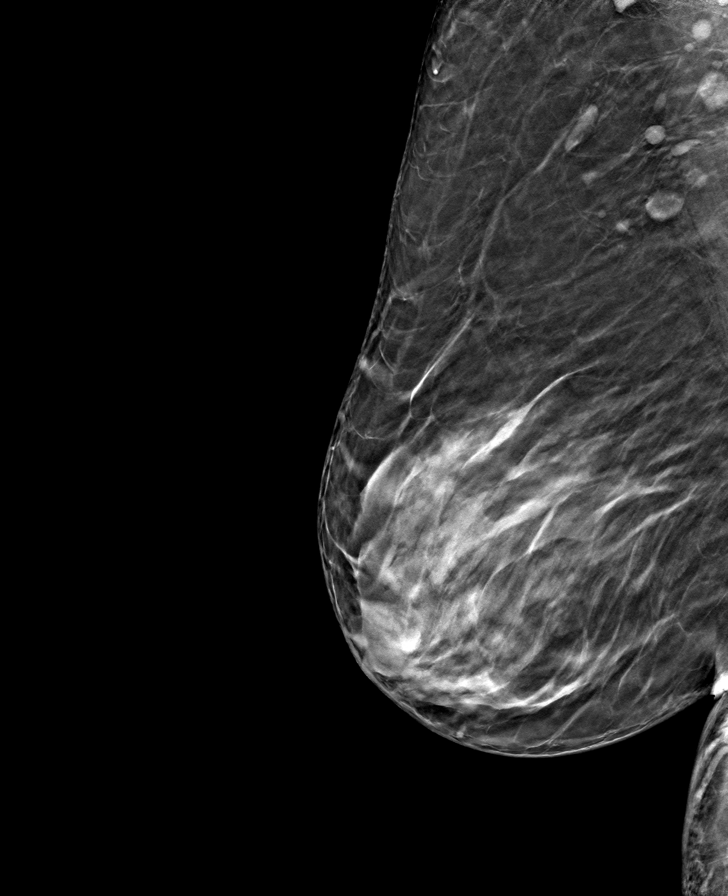

[R CC tomo · tomo slice 24/47.0]
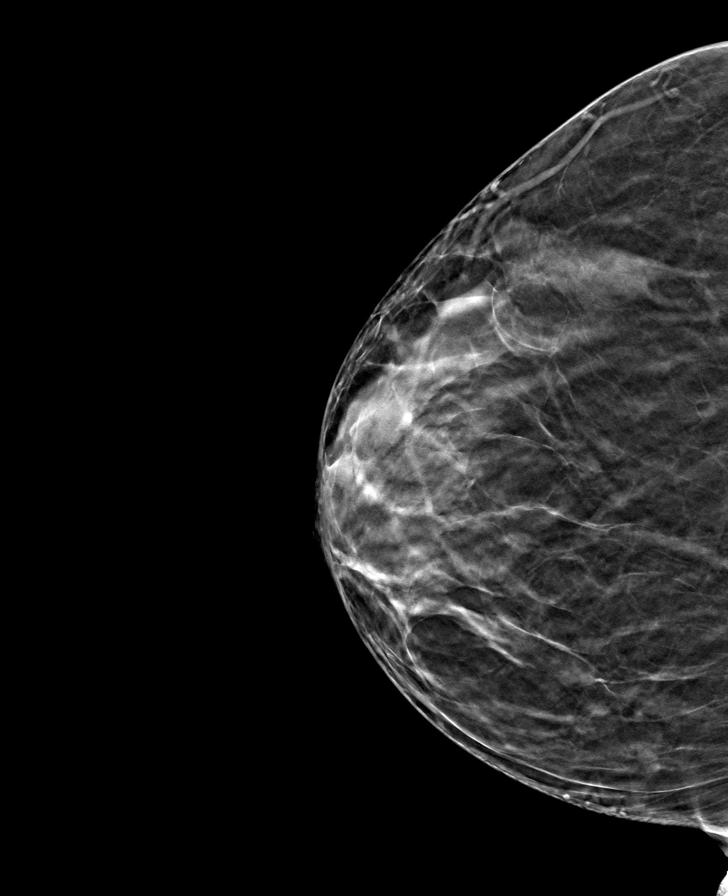

[L CC tomo · tomo slice 33/65.0]
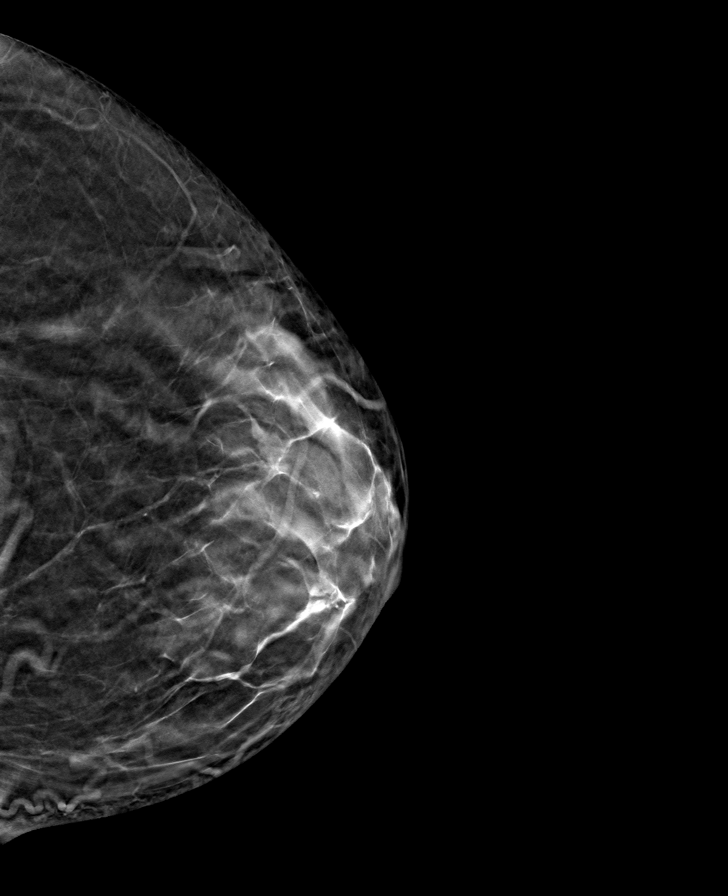

[8 of 24 positions shown; findings below may reference images not displayed]

ACR Breast Density Category c: The breast tissue is heterogeneously
dense, which may obscure small masses.
FINDINGS: There are no findings suspicious for malignancy. Images were
processed with CAD.
IMPRESSION: No mammographic evidence of malignancy. A result letter of this
screening mammogram will be mailed directly to the patient.

RECOMMENDATION:
Screening mammogram in one year. (Code:[id number])

BI-RADS CATEGORY  1: Negative.

## 2019-07-01 ENCOUNTER — Other Ambulatory Visit: Payer: Self-pay | Admitting: Family Medicine

## 2019-07-01 DIAGNOSIS — Z1231 Encounter for screening mammogram for malignant neoplasm of breast: Secondary | ICD-10-CM

## 2019-10-30 ENCOUNTER — Other Ambulatory Visit: Payer: Self-pay | Admitting: *Deleted

## 2019-10-30 DIAGNOSIS — Z20822 Contact with and (suspected) exposure to covid-19: Secondary | ICD-10-CM

## 2019-11-01 LAB — NOVEL CORONAVIRUS, NAA: SARS-CoV-2, NAA: NOT DETECTED

## 2019-11-06 ENCOUNTER — Telehealth: Payer: Self-pay

## 2019-11-06 NOTE — Telephone Encounter (Signed)
Caller given negative result and verbalized understanding  

## 2020-02-11 ENCOUNTER — Other Ambulatory Visit: Payer: Self-pay

## 2020-02-11 ENCOUNTER — Ambulatory Visit (INDEPENDENT_AMBULATORY_CARE_PROVIDER_SITE_OTHER): Payer: Medicare Other | Admitting: Podiatry

## 2020-02-11 DIAGNOSIS — B351 Tinea unguium: Secondary | ICD-10-CM

## 2020-02-11 DIAGNOSIS — M79674 Pain in right toe(s): Secondary | ICD-10-CM | POA: Diagnosis not present

## 2020-02-11 DIAGNOSIS — M79675 Pain in left toe(s): Secondary | ICD-10-CM

## 2020-02-13 ENCOUNTER — Encounter: Payer: Self-pay | Admitting: Podiatry

## 2020-02-13 NOTE — Progress Notes (Addendum)
  Subjective:  Patient ID: Shirley Knight, female    DOB: 05-28-1941,  MRN: [id number]  Chief Complaint  Patient presents with  . Nail Problem    pt is here for bil thick toenails, she would like to have looked at.   79 y.o. female returns for the above complaint.  Patient presents with thickened elongated mycotic toenails x8.  Patient states that they are very painful in nature.  She states that she is considering having all the toenails removed as is causing her a lot of pain the amount of dystrophy that is present.  She is not able to debride them down herself.  She denies any other acute complaints.  She would like to know if this could be done here in the office versus surgery center to have them all removed.  I explained to her that today I will not be able to do the removal of the toenail.  I will debride the toenails today and if she gets relief from that we will consider removing the toenails next time.  Objective:  There were no vitals filed for this visit. Podiatric Exam: Vascular: dorsalis pedis and posterior tibial pulses are palpable bilateral. Capillary return is immediate. Temperature gradient is WNL. Skin turgor WNL  Sensorium: Normal Semmes Weinstein monofilament test. Normal tactile sensation bilaterally. Nail Exam: Pt has thick disfigured discolored nails with subungual debris noted bilateral entire nail second through fifth toenails Ulcer Exam: There is no evidence of ulcer or pre-ulcerative changes or infection. Orthopedic Exam: Muscle tone and strength are WNL. No limitations in general ROM. No crepitus or effusions noted. HAV  B/L.  Hammer toes 2-5  B/L.  Skin: No Porokeratosis. No infection or ulcers  Assessment & Plan:  Patient was evaluated and treated and all questions answered.  Onychomycosis with pain  -Nails palliatively debrided as below. -Educated on self-care  Procedure: Nail Debridement Rationale: pain  Type of Debridement: manual, sharp  debridement. Instrumentation: Nail nipper, rotary burr. Number of Nails: 8  Procedures and Treatment: Consent by patient was obtained for treatment procedures. The patient understood the discussion of treatment and procedures well. All questions were answered thoroughly reviewed. Debridement of mycotic and hypertrophic toenails, 1 through 5 bilateral and clearing of subungual debris. No ulceration, no infection noted.  Return Visit-Office Procedure: Patient instructed to return to the office for a follow up visit 3 months for continued evaluation and treatment.  Nicholes Rough, DPM    Return in about 2 weeks (around 02/25/2020), or definitely need a 30 min slot.

## 2020-02-25 ENCOUNTER — Encounter: Payer: Medicare Other | Admitting: Podiatry

## 2020-02-27 ENCOUNTER — Ambulatory Visit
Admission: RE | Admit: 2020-02-27 | Discharge: 2020-02-27 | Disposition: A | Discharge status: Home / Self Care | Payer: Medicare Other | Source: Ambulatory Visit | Attending: Family Medicine | Admitting: Family Medicine

## 2020-02-27 DIAGNOSIS — Z1231 Encounter for screening mammogram for malignant neoplasm of breast: Secondary | ICD-10-CM | POA: Diagnosis present

## 2020-03-06 ENCOUNTER — Other Ambulatory Visit: Payer: Self-pay | Admitting: Family Medicine

## 2020-03-06 DIAGNOSIS — R928 Other abnormal and inconclusive findings on diagnostic imaging of breast: Secondary | ICD-10-CM

## 2020-03-06 DIAGNOSIS — N6489 Other specified disorders of breast: Secondary | ICD-10-CM

## 2020-03-13 ENCOUNTER — Other Ambulatory Visit: Payer: Medicare Other

## 2020-03-13 ENCOUNTER — Ambulatory Visit: Payer: Medicare Other | Attending: Family Medicine

## 2020-03-27 ENCOUNTER — Ambulatory Visit
Admission: RE | Admit: 2020-03-27 | Discharge: 2020-03-27 | Disposition: A | Discharge status: Home / Self Care | Payer: Medicare Other | Source: Ambulatory Visit | Attending: Family Medicine | Admitting: Family Medicine

## 2020-03-27 DIAGNOSIS — R928 Other abnormal and inconclusive findings on diagnostic imaging of breast: Secondary | ICD-10-CM | POA: Insufficient documentation

## 2020-03-27 DIAGNOSIS — N6489 Other specified disorders of breast: Secondary | ICD-10-CM | POA: Diagnosis present

## 2020-05-28 ENCOUNTER — Other Ambulatory Visit: Payer: Self-pay

## 2020-05-28 ENCOUNTER — Ambulatory Visit (INDEPENDENT_AMBULATORY_CARE_PROVIDER_SITE_OTHER): Payer: Medicare Other | Admitting: Podiatry

## 2020-05-28 ENCOUNTER — Encounter: Payer: Self-pay | Admitting: Podiatry

## 2020-05-28 DIAGNOSIS — M79674 Pain in right toe(s): Secondary | ICD-10-CM | POA: Diagnosis not present

## 2020-05-28 DIAGNOSIS — M79675 Pain in left toe(s): Secondary | ICD-10-CM

## 2020-05-28 DIAGNOSIS — B351 Tinea unguium: Secondary | ICD-10-CM | POA: Diagnosis not present

## 2020-05-28 NOTE — Progress Notes (Signed)
This patient returns to the office for evaluation and treatment of long thick painful nails .  This patient is unable to trim her own nails since the patient cannot reach her feet.  Patient says the nails are painful walking and wearing his shoes.  She returns for preventive foot care services.  General Appearance  Alert, conversant and in no acute stress.  Vascular  Dorsalis pedis and posterior tibial  pulses are palpable  bilaterally.  Capillary return is within normal limits  bilaterally. Temperature is within normal limits  bilaterally.  Neurologic  Senn-Weinstein monofilament wire test within normal limits  bilaterally. Muscle power within normal limits bilaterally.  Nails Thick disfigured discolored nails with subungual debris  from second  to fifth toes bilaterally.  Absent hallux nail plate. No evidence of bacterial infection or drainage bilaterally.  Orthopedic  No limitations of motion  feet .  No crepitus or effusions noted.  No bony pathology or digital deformities noted.  Skin  normotropic skin with no porokeratosis noted bilaterally.  No signs of infections or ulcers noted.     Onychomycosis  Pain in toes right foot  Pain in toes left foot  Debridement  of nails  2-5  B/L with a nail nipper.  Nails were then filed using a dremel tool with no incidents.    RTC prn.  Discussed her nails with this patient.  She is interested in having her second and third toenails on both feet removed.  She has been very pleased with the removal of the great toenails on both feet.  I checked her vascular status and her past history and I see no contraindications to surgery.  She was told to return to the office for nail care if she wants to have routine foot care provided.  If she wants to have surgery she should make an appointment with Dr. Allena Katz.  That will enable him to evaluate her surgical status.   Helane Gunther DPM

## 2020-06-11 ENCOUNTER — Ambulatory Visit: Payer: Medicare Other | Admitting: Podiatry

## 2021-01-11 ENCOUNTER — Encounter: Payer: Self-pay | Admitting: Intensive Care

## 2021-01-11 ENCOUNTER — Other Ambulatory Visit: Payer: Self-pay

## 2021-01-11 ENCOUNTER — Emergency Department: Payer: Medicare Other

## 2021-01-11 ENCOUNTER — Emergency Department
Admission: EM | Admit: 2021-01-11 | Discharge: 2021-01-11 | Disposition: A | Discharge status: Home / Self Care | Payer: Medicare Other | Attending: Emergency Medicine | Admitting: Emergency Medicine

## 2021-01-11 DIAGNOSIS — I10 Essential (primary) hypertension: Secondary | ICD-10-CM | POA: Insufficient documentation

## 2021-01-11 DIAGNOSIS — M25511 Pain in right shoulder: Secondary | ICD-10-CM

## 2021-01-11 DIAGNOSIS — Z7982 Long term (current) use of aspirin: Secondary | ICD-10-CM | POA: Insufficient documentation

## 2021-01-11 DIAGNOSIS — Z79899 Other long term (current) drug therapy: Secondary | ICD-10-CM | POA: Diagnosis not present

## 2021-01-11 DIAGNOSIS — E039 Hypothyroidism, unspecified: Secondary | ICD-10-CM | POA: Insufficient documentation

## 2021-01-11 DIAGNOSIS — M25561 Pain in right knee: Secondary | ICD-10-CM

## 2021-01-11 DIAGNOSIS — M25512 Pain in left shoulder: Secondary | ICD-10-CM | POA: Insufficient documentation

## 2021-01-11 DIAGNOSIS — F17210 Nicotine dependence, cigarettes, uncomplicated: Secondary | ICD-10-CM | POA: Diagnosis not present

## 2021-01-11 MED ORDER — CELECOXIB 50 MG PO CAPS: 50.0000 mg | ORAL_CAPSULE | Freq: Two times a day (BID) | ORAL | 0 refills | Status: AC

## 2021-01-11 NOTE — Discharge Instructions (Signed)
Take Celebrex as directed

## 2021-01-11 NOTE — ED Provider Notes (Signed)
[facility]-EMERGENCY DEPARTMENT  ____________________________________________  Time seen: Approximately 4:13 PM  I have reviewed the triage vital signs and the nursing notes.   HISTORY  Chief Complaint Knee Pain and Shoulder Pain   Historian Patient     HPI Shirley Knight is a 80 y.o. female presents to the emergency department with bilateral shoulder pain and right knee pain.  Knee pain is posterior and seems to radiate to the right calf.  Patient states that she did have a fall approximately 3 weeks ago and fell onto her right knee and then left knee.  She states that she did not hit her shoulder during fall.  She recently moved into a second floor apartment and has not been engaging in any recent heavy lifting.  She denies numbness or tingling in the upper and lower extremities.  No chest pain, chest tightness or abdominal pain.  No abrasions or lacerations.  No other alleviating measures have been attempted.   Past Medical History:  Diagnosis Date  . Arthritis   . GERD (gastroesophageal reflux disease)   . Gout   . Hyperlipidemia   . Hypertension   . Hypothyroidism   . Rotator cuff tear, left   . Wears dentures    partial upper     Immunizations up to date:  Yes.     Past Medical History:  Diagnosis Date  . Arthritis   . GERD (gastroesophageal reflux disease)   . Gout   . Hyperlipidemia   . Hypertension   . Hypothyroidism   . Rotator cuff tear, left   . Wears dentures    partial upper    There are no problems to display for this patient.   Past Surgical History:  Procedure Laterality Date  . CATARACT EXTRACTION W/PHACO Left 09/12/2017   Procedure: CATARACT EXTRACTION PHACO AND INTRAOCULAR LENS PLACEMENT (IOC)  LEFT;  Surgeon: Nevada Crane, MD;  Location: [facility];  Service: Ophthalmology;  Laterality: Left;  . CATARACT EXTRACTION W/PHACO Right 10/03/2017   Procedure: CATARACT EXTRACTION PHACO AND INTRAOCULAR LENS PLACEMENT (IOC)  RIGHT;   Surgeon: Nevada Crane, MD;  Location: [facility];  Service: Ophthalmology;  Laterality: Right;  . CHOLECYSTECTOMY    . KNEE SURGERY Left 05/09/2014   [facility] - Dr. Elfredia Nevins    Prior to Admission medications   Medication Sig Start Date End Date Taking? Authorizing Provider  celecoxib (CELEBREX) 50 MG capsule Take 1 capsule (50 mg total) by mouth 2 (two) times daily for 7 days. 01/11/21 01/18/21 Yes Pia Mau M, PA-C  allopurinol (ZYLOPRIM) 100 MG tablet Take 100 mg by mouth daily.    [provider]  amLODipine (NORVASC) 5 MG tablet Take 5 mg by mouth daily.    [provider]  aspirin 81 MG tablet Take 81 mg by mouth daily.    [provider]  atorvastatin (LIPITOR) 10 MG tablet Take 10 mg by mouth daily.    [provider]  CALCIUM PO Take by mouth daily.    [provider]  Cholecalciferol (VITAMIN D3) 2000 units TABS Take by mouth daily.    [provider]  Eluxadoline 100 MG TABS Take by mouth daily.    [provider]  HYDROcodone-acetaminophen (NORCO/VICODIN) 5-325 MG tablet Take 1 tablet by mouth 2 (two) times daily as needed for moderate pain.    [provider]  levothyroxine (SYNTHROID, LEVOTHROID) 88 MCG tablet Take 88 mcg by mouth daily before breakfast.    [provider]  meloxicam (MOBIC) 15 MG tablet Take 15 mg by mouth daily as needed for pain.    [provider]  meloxicam (MOBIC) 7.5 MG tablet Take 1 tablet (7.5 mg total) by mouth daily. 04/17/18   Joni Reining, PA-C  metoprolol-hydrochlorothiazide (LOPRESSOR HCT) 100-25 MG tablet Take 1 tablet by mouth daily.    [provider]  Omega-3 Fatty Acids (FISH OIL) 1000 MG CPDR Take by mouth daily.    [provider]  omeprazole (PRILOSEC) 40 MG capsule Take 40 mg by mouth daily.    [provider]  traMADol (ULTRAM) 50 MG tablet Take by mouth every 6 (six) hours as needed.    [provider]    Allergies Celecoxib and Penicillins  Family History  Problem Relation Age of Onset  . Breast cancer Mother 68  . Breast cancer Sister     Social History Social History   Tobacco Use  . Smoking status: Current Some Day Smoker    Types: Cigarettes  . Smokeless tobacco: Never Used  Vaping Use  . Vaping Use: Never used  Substance Use Topics  . Alcohol use: Yes    Comment: occ  . Drug use: Never     Review of Systems  Constitutional: No fever/chills Eyes:  No discharge ENT: No upper respiratory complaints. Respiratory: no cough. No SOB/ use of accessory muscles to breath Gastrointestinal:   No nausea, no vomiting.  No diarrhea.  No constipation. Musculoskeletal: Patient has bilateral shoulder pain and right knee pain.  Skin: Negative for rash, abrasions, lacerations, ecchymosis.    ____________________________________________   PHYSICAL EXAM:  VITAL SIGNS: ED Triage Vitals  Enc Vitals Group     BP 01/11/21 1519 125/78     Pulse Rate 01/11/21 1519 (!) 58     Resp 01/11/21 1519 16     Temp 01/11/21 1519 98.6 F (37 C)     Temp Source 01/11/21 1519 Oral     SpO2 01/11/21 1519 98 %     Weight 01/11/21 1520 185 lb (83.9 kg)     Height 01/11/21 1520 5' 2.5" (1.588 m)     Head Circumference --      Peak Flow --      Pain Score 01/11/21 1519 10     Pain Loc --      Pain Edu? --      Excl. in GC? --      Constitutional: Alert and oriented. Well appearing and in no acute distress. Eyes: Conjunctivae are normal. PERRL. EOMI. Head: Atraumatic. Cardiovascular: Normal rate, regular rhythm. Normal S1 and S2.  Good peripheral circulation. Respiratory: Normal respiratory effort without tachypnea or retractions. Lungs CTAB. Good air entry to the bases with no decreased or absent breath sounds Gastrointestinal: Bowel sounds x 4 quadrants. Soft and nontender to palpation. No guarding or rigidity. No distention. Musculoskeletal: Full range of motion to  all extremities. No obvious deformities noted.  No pain with internal and external rotation at the right hip.  Patient does have tenderness to palpation along the posterior aspect of the right knee.  No right calf pain to palpation. Neurologic:  Normal for age. No gross focal neurologic deficits are appreciated.  Skin:  Skin is warm, dry and intact. No rash noted. Psychiatric: Mood and affect are normal for age. Speech and behavior are normal.   ____________________________________________   LABS (all labs ordered are listed, but only abnormal results are displayed)  Labs Reviewed - No data to  display ____________________________________________  EKG   ____________________________________________  RADIOLOGY Geraldo PitterI, Ayen Viviano M Terran Klinke, personally viewed and evaluated these images (plain radiographs) as part of my medical decision making, as well as reviewing the written report by the radiologist.    US Venous Img Lower Unilateral Right  Result Date: 01/11/2021 CLINICAL DATA:  Right knee pain EXAM: RIGHT LOWER EXTREMITY VENOUS DOPPLER ULTRASOUND TECHNIQUE: Gray-scale sonography with graded compression, as well as color Doppler and duplex ultrasound were performed to evaluate the lower extremity deep venous systems from the level of the common femoral vein and including the common femoral, femoral, profunda femoral, popliteal and calf veins including the posterior tibial, peroneal and gastrocnemius veins when visible. The superficial great saphenous vein was also interrogated. Spectral Doppler was utilized to evaluate flow at rest and with distal augmentation maneuvers in the common femoral, femoral and popliteal veins. COMPARISON:  None. FINDINGS: Contralateral Common Femoral Vein: Respiratory phasicity is normal and symmetric with the symptomatic side. No evidence of thrombus. Normal compressibility. Common Femoral Vein: No evidence of thrombus. Normal compressibility, respiratory phasicity and  response to augmentation. Saphenofemoral Junction: No evidence of thrombus. Normal compressibility and flow on color Doppler imaging. Profunda Femoral Vein: No evidence of thrombus. Normal compressibility and flow on color Doppler imaging. Femoral Vein: No evidence of thrombus. Normal compressibility, respiratory phasicity and response to augmentation. Popliteal Vein: No evidence of thrombus. Normal compressibility, respiratory phasicity and response to augmentation. Calf Veins: No evidence of thrombus. Normal compressibility and flow on color Doppler imaging. Superficial Great Saphenous Vein: No evidence of thrombus. Normal compressibility. Venous Reflux:  None. Other Findings:  None. IMPRESSION: No evidence of right lower extremity deep venous thrombosis. Electronically Signed   By: Duanne GuessNicholas  Plundo D.O.   On: 01/11/2021 16:56   DG Knee Complete 4 Views Right  Result Date: 01/11/2021 CLINICAL DATA:  Acute right knee pain and swelling. EXAM: RIGHT KNEE - COMPLETE 4+ VIEW COMPARISON:  Apr 17, 2018. FINDINGS: No evidence of fracture, dislocation, or joint effusion. Moderate narrowing of medial joint space is noted. Soft tissues are unremarkable. IMPRESSION: Moderate degenerative joint disease. No acute abnormality seen in the right knee. Electronically Signed   By: Lupita RaiderJames  Green Jr M.D.   On: 01/11/2021 16:32    ____________________________________________    PROCEDURES  Procedure(s) performed:     Procedures     Medications - No data to display   ____________________________________________   INITIAL IMPRESSION / ASSESSMENT AND PLAN / ED COURSE  Pertinent labs & imaging results that were available during my care of the patient were reviewed by me and considered in my medical decision making (see chart for details).       Assessment and Plan:  Knee pain:  Bilateral shoulder pain: 80 year old female presents to the emergency department with bilateral shoulder pain and right knee  pain.  Patient was mildly bradycardic at triage but vital signs were otherwise reassuring.  Patient did have posterior right knee pain to palpation but no calf tenderness and no pain with internal and external rotation at the right hip.  Differential diagnosis includes fracture, DVT, acute on chronic musculoskeletal pain...  X-ray of the right knee reveals no acute bony abnormality.  No evidence of DVT on venous ultrasound.  Patient has an appointment with her orthopedist on Wednesday.  Will start patient on Cox 2 selective NSAID, Celebrex.  ____________________________________________  FINAL CLINICAL IMPRESSION(S) / ED DIAGNOSES  Final diagnoses:  Acute pain of right knee  Acute pain of both shoulders  NEW MEDICATIONS STARTED DURING THIS VISIT:  ED Discharge Orders         Ordered    celecoxib (CELEBREX) 50 MG capsule  2 times daily        01/11/21 1727              This chart was dictated using voice recognition software/Dragon. Despite best efforts to proofread, errors can occur which can change the meaning. Any change was purely unintentional.     Orvil Feil, PA-C 01/11/21 1751    Gilles Chiquito, MD 01/12/21 [phone]

## 2021-01-11 NOTE — ED Triage Notes (Signed)
Patient c/o right knee pain and bilateral shoulder pain.  Reports hx cortisone shots in all. Denies recent injury or fall. Patient drove self to ER

## 2021-03-04 ENCOUNTER — Other Ambulatory Visit: Payer: Self-pay | Admitting: Family Medicine

## 2021-03-04 DIAGNOSIS — Z1231 Encounter for screening mammogram for malignant neoplasm of breast: Secondary | ICD-10-CM

## 2021-03-22 ENCOUNTER — Ambulatory Visit
Admission: RE | Admit: 2021-03-22 | Discharge: 2021-03-22 | Disposition: A | Discharge status: Home / Self Care | Payer: Medicare Other | Source: Ambulatory Visit | Attending: Family Medicine | Admitting: Family Medicine

## 2021-03-22 ENCOUNTER — Other Ambulatory Visit: Payer: Self-pay

## 2021-03-22 DIAGNOSIS — Z1231 Encounter for screening mammogram for malignant neoplasm of breast: Secondary | ICD-10-CM | POA: Diagnosis present

## 2021-06-01 ENCOUNTER — Emergency Department
Admission: EM | Admit: 2021-06-01 | Discharge: 2021-06-02 | Disposition: A | Discharge status: Home / Self Care | Payer: Medicare Other | Attending: Student in an Organized Health Care Education/Training Program | Admitting: Student in an Organized Health Care Education/Training Program

## 2021-06-01 ENCOUNTER — Other Ambulatory Visit: Payer: Self-pay

## 2021-06-01 ENCOUNTER — Emergency Department: Payer: Medicare Other

## 2021-06-01 ENCOUNTER — Encounter: Payer: Self-pay | Admitting: *Deleted

## 2021-06-01 DIAGNOSIS — I10 Essential (primary) hypertension: Secondary | ICD-10-CM | POA: Diagnosis not present

## 2021-06-01 DIAGNOSIS — Z79899 Other long term (current) drug therapy: Secondary | ICD-10-CM | POA: Insufficient documentation

## 2021-06-01 DIAGNOSIS — F17210 Nicotine dependence, cigarettes, uncomplicated: Secondary | ICD-10-CM | POA: Diagnosis not present

## 2021-06-01 DIAGNOSIS — R42 Dizziness and giddiness: Secondary | ICD-10-CM | POA: Insufficient documentation

## 2021-06-01 DIAGNOSIS — Z7982 Long term (current) use of aspirin: Secondary | ICD-10-CM | POA: Insufficient documentation

## 2021-06-01 DIAGNOSIS — E039 Hypothyroidism, unspecified: Secondary | ICD-10-CM | POA: Insufficient documentation

## 2021-06-01 DIAGNOSIS — E876 Hypokalemia: Secondary | ICD-10-CM | POA: Diagnosis not present

## 2021-06-01 LAB — CBC
HCT: 42.5 % (ref 36.0–46.0)
Hemoglobin: 14.6 g/dL (ref 12.0–15.0)
MCH: 34.6 pg — ABNORMAL HIGH (ref 26.0–34.0)
MCHC: 34.4 g/dL (ref 30.0–36.0)
MCV: 100.7 fL — ABNORMAL HIGH (ref 80.0–100.0)
Platelets: 223 10*3/uL (ref 150–400)
RBC: 4.22 MIL/uL (ref 3.87–5.11)
RDW: 14.1 % (ref 11.5–15.5)
WBC: 8.2 10*3/uL (ref 4.0–10.5)
nRBC: 0 % (ref 0.0–0.2)

## 2021-06-01 LAB — URINALYSIS, COMPLETE (UACMP) WITH MICROSCOPIC
Bacteria, UA: NONE SEEN
Bilirubin Urine: NEGATIVE
Glucose, UA: NEGATIVE mg/dL
Hgb urine dipstick: NEGATIVE
Ketones, ur: NEGATIVE mg/dL
Leukocytes,Ua: NEGATIVE
Nitrite: NEGATIVE
Protein, ur: NEGATIVE mg/dL
Specific Gravity, Urine: 1.004 — ABNORMAL LOW (ref 1.005–1.030)
pH: 7 (ref 5.0–8.0)

## 2021-06-01 LAB — BASIC METABOLIC PANEL
Anion gap: 11 (ref 5–15)
BUN: 18 mg/dL (ref 8–23)
CO2: 24 mmol/L (ref 22–32)
Calcium: 9.3 mg/dL (ref 8.9–10.3)
Chloride: 100 mmol/L (ref 98–111)
Creatinine, Ser: 1.04 mg/dL — ABNORMAL HIGH (ref 0.44–1.00)
GFR, Estimated: 55 mL/min — ABNORMAL LOW (ref >60)
Glucose, Bld: 94 mg/dL (ref 70–99)
Potassium: 2.7 mmol/L — CL (ref 3.5–5.1)
Sodium: 135 mmol/L (ref 135–145)

## 2021-06-01 LAB — TROPONIN I (HIGH SENSITIVITY): Troponin I (High Sensitivity): 3 ng/L (ref <18)

## 2021-06-01 LAB — CBG MONITORING, ED: Glucose-Capillary: 107 mg/dL — ABNORMAL HIGH (ref 70–99)

## 2021-06-01 LAB — MAGNESIUM: Magnesium: 1.8 mg/dL (ref 1.7–2.4)

## 2021-06-01 MED ORDER — POTASSIUM CHLORIDE 10 MEQ/100ML IV SOLN
10.0000 meq | Freq: Once | INTRAVENOUS | Status: AC
  Administered 2021-06-02: 10 meq via INTRAVENOUS
  Filled 2021-06-01: qty 100

## 2021-06-01 MED ORDER — MAGNESIUM SULFATE 2 GM/50ML IV SOLN
2.0000 g | Freq: Once | INTRAVENOUS | Status: AC
  Administered 2021-06-01: 2 g via INTRAVENOUS
  Filled 2021-06-01: qty 50

## 2021-06-01 MED ORDER — SODIUM CHLORIDE 0.9 % IV BOLUS
500.0000 mL | Freq: Once | INTRAVENOUS | Status: AC
  Administered 2021-06-01: 500 mL via INTRAVENOUS

## 2021-06-01 MED ORDER — POTASSIUM CHLORIDE CRYS ER 20 MEQ PO TBCR: 20.0000 meq | EXTENDED_RELEASE_TABLET | Freq: Every day | ORAL | 0 refills | Status: DC

## 2021-06-01 MED ORDER — POTASSIUM CHLORIDE CRYS ER 20 MEQ PO TBCR
40.0000 meq | EXTENDED_RELEASE_TABLET | Freq: Once | ORAL | Status: AC
  Administered 2021-06-01: 40 meq via ORAL
  Filled 2021-06-01: qty 2

## 2021-06-01 MED ORDER — POTASSIUM CHLORIDE 10 MEQ/100ML IV SOLN
10.0000 meq | Freq: Once | INTRAVENOUS | Status: AC
  Administered 2021-06-01: 10 meq via INTRAVENOUS

## 2021-06-01 NOTE — Discharge Instructions (Addendum)
Follow up with PCP for repeat blood work to check potassium level.

## 2021-06-01 NOTE — ED Triage Notes (Signed)
She reports that around 2 hours ago she was out for dinner, and she had 1 cocktail and she had onset of dizziness. She says she does feel somewhat better now. Denies any other symptoms. Clear speech, equal grip and strength.

## 2021-06-01 NOTE — ED Notes (Signed)
Attempted for 20g at L arm.

## 2021-06-01 NOTE — ED Notes (Signed)
Patient provided pillow and warm blankets.   Patient reports dizziness after dinner. Patient reports dizziness has since resolved. Patient reports no known hx of low K+, but reports recent change in medications. Patient denies chest pain, or SOB. Patient alert, oriented x4.

## 2021-06-01 NOTE — ED Provider Notes (Signed)
[facility] Emergency Department Provider Note    Event Date/Time   First MD Initiated Contact with Patient 06/01/21 2149     (approximate)  I have reviewed the triage vital signs and the nursing notes.   HISTORY  Chief Complaint Dizziness    HPI Shirley Knight is a 80 y.o. female below listed past medical history presents to the ER for evaluation of dizziness and feeling "high ".  After she was out tonight.  States that she did have a cocktail but felt like it was stronger than normal.  Denies any numbness or tingling.  No fall.  States that she feels well now.  States that her doctor has been increasing her blood pressure medication at home.  Does not take potassium supplement.  Denies any chest pain or pressure.  No shortness of breath.  Past Medical History:  Diagnosis Date   Arthritis    GERD (gastroesophageal reflux disease)    Gout    Hyperlipidemia    Hypertension    Hypothyroidism    Rotator cuff tear, left    Wears dentures    partial upper   Family History  Problem Relation Age of Onset   Breast cancer Mother 54   Breast cancer Sister    Past Surgical History:  Procedure Laterality Date   CATARACT EXTRACTION W/PHACO Left 09/12/2017   Procedure: CATARACT EXTRACTION PHACO AND INTRAOCULAR LENS PLACEMENT (IOC)  LEFT;  Surgeon: Nevada Crane, MD;  Location: [facility];  Service: Ophthalmology;  Laterality: Left;   CATARACT EXTRACTION W/PHACO Right 10/03/2017   Procedure: CATARACT EXTRACTION PHACO AND INTRAOCULAR LENS PLACEMENT (IOC)  RIGHT;  Surgeon: Nevada Crane, MD;  Location: [facility];  Service: Ophthalmology;  Laterality: Right;   CHOLECYSTECTOMY     KNEE SURGERY Left 05/09/2014   [facility] - Dr. Elfredia Nevins   There are no problems to display for this patient.     Prior to Admission medications   Medication Sig Start Date End Date Taking? Authorizing Provider  potassium chloride SA (KLOR-CON) 20 MEQ  tablet Take 1 tablet (20 mEq total) by mouth daily for 7 days. 06/01/21 06/08/21 Yes Willy Eddy, MD  allopurinol (ZYLOPRIM) 100 MG tablet Take 100 mg by mouth daily.    [provider]  amLODipine (NORVASC) 5 MG tablet Take 5 mg by mouth daily.    [provider]  aspirin 81 MG tablet Take 81 mg by mouth daily.    [provider]  atorvastatin (LIPITOR) 10 MG tablet Take 10 mg by mouth daily.    [provider]  CALCIUM PO Take by mouth daily.    [provider]  Cholecalciferol (VITAMIN D3) 2000 units TABS Take by mouth daily.    [provider]  Eluxadoline 100 MG TABS Take by mouth daily.    [provider]  HYDROcodone-acetaminophen (NORCO/VICODIN) 5-325 MG tablet Take 1 tablet by mouth 2 (two) times daily as needed for moderate pain.    [provider]  levothyroxine (SYNTHROID, LEVOTHROID) 88 MCG tablet Take 88 mcg by mouth daily before breakfast.    [provider]  meloxicam (MOBIC) 15 MG tablet Take 15 mg by mouth daily as needed for pain.    [provider]  meloxicam (MOBIC) 7.5 MG tablet Take 1 tablet (7.5 mg total) by mouth daily. 04/17/18   Joni Reining, PA-C  metoprolol-hydrochlorothiazide (LOPRESSOR HCT) 100-25 MG tablet Take 1 tablet by mouth daily.  [provider]  Omega-3 Fatty Acids (FISH OIL) 1000 MG CPDR Take by mouth daily.    [provider]  omeprazole (PRILOSEC) 40 MG capsule Take 40 mg by mouth daily.    [provider]  traMADol (ULTRAM) 50 MG tablet Take by mouth every 6 (six) hours as needed.    [provider]    Allergies Celecoxib and Penicillins    Social History Social History   Tobacco Use   Smoking status: Some Days    Pack years: 0.00    Types: Cigarettes   Smokeless tobacco: Never  Vaping Use   Vaping Use: Never used  Substance Use Topics   Alcohol use: Yes    Comment: occ   Drug use: Never    Review of  Systems Patient denies headaches, rhinorrhea, blurry vision, numbness, shortness of breath, chest pain, edema, cough, abdominal pain, nausea, vomiting, diarrhea, dysuria, fevers, rashes or hallucinations unless otherwise stated above in HPI. ____________________________________________   PHYSICAL EXAM:  VITAL SIGNS: Vitals:   06/01/21 1941 06/01/21 2150  BP: (!) 135/92 (!) 148/74  Pulse: 68 67  Resp: 16 16  Temp: 98.1 F (36.7 C) 98 F (36.7 C)  SpO2: 100% 100%    Constitutional: Alert and oriented.  Eyes: Conjunctivae are normal.  Head: Atraumatic. Nose: No congestion/rhinnorhea. Mouth/Throat: Mucous membranes are moist.   Neck: No stridor. Painless ROM.  Cardiovascular: Normal rate, regular rhythm. Grossly normal heart sounds.  Good peripheral circulation. Respiratory: Normal respiratory effort.  No retractions. Lungs CTAB. Gastrointestinal: Soft and nontender. No distention. No abdominal bruits. No CVA tenderness. Genitourinary:  Musculoskeletal: No lower extremity tenderness nor edema.  No joint effusions. Neurologic:  CN- intact.  No facial droop, Normal FNF.  Normal heel to shin.  Sensation intact bilaterally. Normal speech and language. No gross focal neurologic deficits are appreciated. No gait instability. Skin:  Skin is warm, dry and intact. No rash noted. Psychiatric: Mood and affect are normal. Speech and behavior are normal.  ____________________________________________   LABS (all labs ordered are listed, but only abnormal results are displayed)  Results for orders placed or performed during the hospital encounter of 06/01/21 (from the past 24 hour(s))  Basic metabolic panel     Status: Abnormal   Collection Time: 06/01/21  7:45 PM  Result Value Ref Range   Sodium 135 135 - 145 mmol/L   Potassium 2.7 (LL) 3.5 - 5.1 mmol/L   Chloride 100 98 - 111 mmol/L   CO2 24 22 - 32 mmol/L   Glucose, Bld 94 70 - 99 mg/dL   BUN 18 8 - 23 mg/dL   Creatinine, Ser 5.10  (H) 0.44 - 1.00 mg/dL   Calcium 9.3 8.9 - 25.8 mg/dL   GFR, Estimated 55 (L) >60 mL/min   Anion gap 11 5 - 15  CBC     Status: Abnormal   Collection Time: 06/01/21  7:45 PM  Result Value Ref Range   WBC 8.2 4.0 - 10.5 K/uL   RBC 4.22 3.87 - 5.11 MIL/uL   Hemoglobin 14.6 12.0 - 15.0 g/dL   HCT 52.7 78.2 - 42.3 %   MCV 100.7 (H) 80.0 - 100.0 fL   MCH 34.6 (H) 26.0 - 34.0 pg   MCHC 34.4 30.0 - 36.0 g/dL   RDW 53.6 14.4 - 31.5 %   Platelets 223 150 - 400 K/uL   nRBC 0.0 0.0 - 0.2 %  Urinalysis, Complete w Microscopic     Status: Abnormal  Collection Time: 06/01/21  7:45 PM  Result Value Ref Range   Color, Urine STRAW (A) YELLOW   APPearance CLEAR (A) CLEAR   Specific Gravity, Urine 1.004 (L) 1.005 - 1.030   pH 7.0 5.0 - 8.0   Glucose, UA NEGATIVE NEGATIVE mg/dL   Hgb urine dipstick NEGATIVE NEGATIVE   Bilirubin Urine NEGATIVE NEGATIVE   Ketones, ur NEGATIVE NEGATIVE mg/dL   Protein, ur NEGATIVE NEGATIVE mg/dL   Nitrite NEGATIVE NEGATIVE   Leukocytes,Ua NEGATIVE NEGATIVE   WBC, UA 0-5 0 - 5 WBC/hpf   Bacteria, UA NONE SEEN NONE SEEN   Squamous Epithelial / LPF 0-5 0 - 5  Troponin I (High Sensitivity)     Status: None   Collection Time: 06/01/21  7:45 PM  Result Value Ref Range   Troponin I (High Sensitivity) 3 <18 ng/L  Magnesium     Status: None   Collection Time: 06/01/21  7:45 PM  Result Value Ref Range   Magnesium 1.8 1.7 - 2.4 mg/dL  CBG monitoring, ED     Status: Abnormal   Collection Time: 06/01/21  7:52 PM  Result Value Ref Range   Glucose-Capillary 107 (H) 70 - 99 mg/dL   ____________________________________________  EKG My review and personal interpretation at Time: 1947   Indication: sinus  Rate: 70  Rhythm: sinus Axis: normal Other: normal intervals, no stemi, no depressions ____________________________________________  RADIOLOGY  I personally reviewed all radiographic images ordered to evaluate for the above acute complaints and reviewed radiology  reports and findings.  These findings were personally discussed with the patient.  Please see medical record for radiology report.  ____________________________________________   PROCEDURES  Procedure(s) performed:  Procedures    Critical Care performed: no ____________________________________________   INITIAL IMPRESSION / ASSESSMENT AND PLAN / ED COURSE  Pertinent labs & imaging results that were available during my care of the patient were reviewed by me and considered in my medical decision making (see chart for details).   DDX: Dehydration, intoxication, electrolyte abnormality, CVA, dysrhythmia  Jeanene ErbDonna W Godley is a 80 y.o. who presents to the ED with presentation as described above.  Patient clinically very well-appearing and in no acute distress.  I have a lower suspicion for TIA based on symptoms and presentation.  No focal neurodeficits.  CT imaging without acute abnormality.  She is not describing any visual disturbance, slurred speech, weakness or sensory deficits seems to be primarily a increased sensation related to alcohol which has since resolved.  Did note that she is hypokalemic which will be repleted here.  No EKG changes.  Likely secondary to increased HCTZ.  Will replete with IV as well as oral potassium will check magnesium.  Patient be signed out to oncoming physician anticipate discharge home with plan for close outpatient follow-up for repeat blood work.     The patient was evaluated in Emergency Department today for the symptoms described in the history of present illness. He/she was evaluated in the context of the global COVID-19 pandemic, which necessitated consideration that the patient might be at risk for infection with the SARS-CoV-2 virus that causes COVID-19. Institutional protocols and algorithms that pertain to the evaluation of patients at risk for COVID-19 are in a state of rapid change based on information released by regulatory bodies including the  CDC and federal and state organizations. These policies and algorithms were followed during the patient's care in the ED.  As part of my medical decision making, I reviewed the following data within  the electronic MEDICAL RECORD NUMBER Nursing notes reviewed and incorporated, Labs reviewed, notes from prior ED visits and [place] Controlled Substance Database   ____________________________________________   FINAL CLINICAL IMPRESSION(S) / ED DIAGNOSES  Final diagnoses:  Dizziness  Hypokalemia      NEW MEDICATIONS STARTED DURING THIS VISIT:  New Prescriptions   POTASSIUM CHLORIDE SA (KLOR-CON) 20 MEQ TABLET    Take 1 tablet (20 mEq total) by mouth daily for 7 days.     Note:  This document was prepared using Dragon voice recognition software and may include unintentional dictation errors.    Willy Eddy, MD 06/01/21 2320

## 2021-06-01 NOTE — ED Notes (Signed)
Patient ambulatory to ED25 with first nurse. Gait steady.

## 2021-06-01 NOTE — ED Notes (Signed)
Patient transported to CT 

## 2021-06-01 NOTE — ED Notes (Signed)
IV will not currently pull back enough blood to collect repeat trop.

## 2021-06-01 NOTE — ED Notes (Signed)
Patient returned to ED from CT. Greta Doom, RN at bedside for INT attempt.

## 2021-06-02 DIAGNOSIS — R42 Dizziness and giddiness: Secondary | ICD-10-CM | POA: Diagnosis not present

## 2021-06-02 LAB — TROPONIN I (HIGH SENSITIVITY): Troponin I (High Sensitivity): 4 ng/L (ref <18)

## 2021-06-02 NOTE — ED Provider Notes (Signed)
Patient received electrolyte supplementation per plan left by Dr. Roxan Hockey.  Plan was to discharge patient after that.  She remains stable and feels improved.  Will discharge home with plan left in place by him.   Nita Sickle, MD 06/02/21 [phone]

## 2022-04-29 ENCOUNTER — Emergency Department: Payer: Medicare Other

## 2022-04-29 ENCOUNTER — Encounter: Payer: Self-pay | Admitting: Emergency Medicine

## 2022-04-29 ENCOUNTER — Other Ambulatory Visit: Payer: Self-pay

## 2022-04-29 ENCOUNTER — Emergency Department
Admission: EM | Admit: 2022-04-29 | Discharge: 2022-04-29 | Disposition: A | Discharge status: Home / Self Care | Payer: Medicare Other | Attending: Emergency Medicine | Admitting: Emergency Medicine

## 2022-04-29 DIAGNOSIS — R519 Headache, unspecified: Secondary | ICD-10-CM | POA: Diagnosis not present

## 2022-04-29 DIAGNOSIS — I700 Atherosclerosis of aorta: Secondary | ICD-10-CM | POA: Diagnosis not present

## 2022-04-29 DIAGNOSIS — M5412 Radiculopathy, cervical region: Secondary | ICD-10-CM | POA: Diagnosis not present

## 2022-04-29 DIAGNOSIS — S22080D Wedge compression fracture of T11-T12 vertebra, subsequent encounter for fracture with routine healing: Secondary | ICD-10-CM | POA: Insufficient documentation

## 2022-04-29 DIAGNOSIS — M542 Cervicalgia: Secondary | ICD-10-CM | POA: Diagnosis present

## 2022-04-29 DIAGNOSIS — I10 Essential (primary) hypertension: Secondary | ICD-10-CM | POA: Insufficient documentation

## 2022-04-29 DIAGNOSIS — S22080S Wedge compression fracture of T11-T12 vertebra, sequela: Secondary | ICD-10-CM

## 2022-04-29 DIAGNOSIS — S32010D Wedge compression fracture of first lumbar vertebra, subsequent encounter for fracture with routine healing: Secondary | ICD-10-CM | POA: Diagnosis not present

## 2022-04-29 DIAGNOSIS — S32010S Wedge compression fracture of first lumbar vertebra, sequela: Secondary | ICD-10-CM

## 2022-04-29 DIAGNOSIS — E039 Hypothyroidism, unspecified: Secondary | ICD-10-CM | POA: Insufficient documentation

## 2022-04-29 LAB — CBC WITH DIFFERENTIAL/PLATELET
Abs Immature Granulocytes: 0.03 10*3/uL (ref 0.00–0.07)
Basophils Absolute: 0 10*3/uL (ref 0.0–0.1)
Basophils Relative: 0 %
Eosinophils Absolute: 0.2 10*3/uL (ref 0.0–0.5)
Eosinophils Relative: 2 %
HCT: 40.6 % (ref 36.0–46.0)
Hemoglobin: 12.7 g/dL (ref 12.0–15.0)
Immature Granulocytes: 0 %
Lymphocytes Relative: 23 %
Lymphs Abs: 2.3 10*3/uL (ref 0.7–4.0)
MCH: 31.4 pg (ref 26.0–34.0)
MCHC: 31.3 g/dL (ref 30.0–36.0)
MCV: 100.2 fL — ABNORMAL HIGH (ref 80.0–100.0)
Monocytes Absolute: 0.9 10*3/uL (ref 0.1–1.0)
Monocytes Relative: 9 %
Neutro Abs: 6.5 10*3/uL (ref 1.7–7.7)
Neutrophils Relative %: 66 %
Platelets: 252 10*3/uL (ref 150–400)
RBC: 4.05 MIL/uL (ref 3.87–5.11)
RDW: 14.8 % (ref 11.5–15.5)
WBC: 9.9 10*3/uL (ref 4.0–10.5)
nRBC: 0 % (ref 0.0–0.2)

## 2022-04-29 LAB — COMPREHENSIVE METABOLIC PANEL
ALT: 12 U/L (ref 0–44)
AST: 21 U/L (ref 15–41)
Albumin: 3.5 g/dL (ref 3.5–5.0)
Alkaline Phosphatase: 117 U/L (ref 38–126)
Anion gap: 6 (ref 5–15)
BUN: 25 mg/dL — ABNORMAL HIGH (ref 8–23)
CO2: 30 mmol/L (ref 22–32)
Calcium: 8.9 mg/dL (ref 8.9–10.3)
Chloride: 104 mmol/L (ref 98–111)
Creatinine, Ser: 1.06 mg/dL — ABNORMAL HIGH (ref 0.44–1.00)
GFR, Estimated: 54 mL/min — ABNORMAL LOW (ref >60)
Glucose, Bld: 113 mg/dL — ABNORMAL HIGH (ref 70–99)
Potassium: 3.5 mmol/L (ref 3.5–5.1)
Sodium: 140 mmol/L (ref 135–145)
Total Bilirubin: 1 mg/dL (ref 0.3–1.2)
Total Protein: 7 g/dL (ref 6.5–8.1)

## 2022-04-29 LAB — CK: Total CK: 103 U/L (ref 38–234)

## 2022-04-29 LAB — TROPONIN I (HIGH SENSITIVITY): Troponin I (High Sensitivity): 4 ng/L (ref <18)

## 2022-04-29 MED ORDER — NAPROXEN 500 MG PO TABS
500.0000 mg | ORAL_TABLET | Freq: Once | ORAL | Status: AC
  Administered 2022-04-29: 500 mg via ORAL
  Filled 2022-04-29: qty 1

## 2022-04-29 MED ORDER — LIDOCAINE 5 % EX PTCH
1.0000 | MEDICATED_PATCH | CUTANEOUS | Status: DC
  Administered 2022-04-29: 1 via TRANSDERMAL
  Filled 2022-04-29: qty 1

## 2022-04-29 NOTE — ED Notes (Signed)
Patient transported to MRI 

## 2022-04-29 NOTE — Discharge Instructions (Addendum)
Recommend taking 1 g of Tylenol every 6 hours and using lidocaine patch every 24 hours.  As we discussed please follow-up with your PCP and neurosurgery.  Please return to emergency room for any new or worsening of your symptoms.

## 2022-04-29 NOTE — ED Notes (Signed)
Pt still in MRI at this time. EKG and blood work unable to be obtained at this time.

## 2022-04-29 NOTE — ED Notes (Signed)
Cervical collar applied
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
## 2022-11-02 ENCOUNTER — Emergency Department
Admission: EM | Admit: 2022-11-02 | Discharge: 2022-11-02 | Disposition: A | Discharge status: Home / Self Care | Payer: Medicare Other | Attending: Emergency Medicine | Admitting: Emergency Medicine

## 2022-11-02 ENCOUNTER — Other Ambulatory Visit: Payer: Self-pay

## 2022-11-02 DIAGNOSIS — M25511 Pain in right shoulder: Secondary | ICD-10-CM

## 2022-11-02 DIAGNOSIS — M25512 Pain in left shoulder: Secondary | ICD-10-CM | POA: Insufficient documentation

## 2022-11-02 MED ORDER — HYDROCODONE-ACETAMINOPHEN 5-325 MG PO TABS: 1.0000 | ORAL_TABLET | Freq: Four times a day (QID) | ORAL | 0 refills | Status: AC | PRN

## 2022-11-02 MED ORDER — ONDANSETRON 4 MG PO TBDP: 4.0000 mg | ORAL_TABLET | Freq: Three times a day (TID) | ORAL | 0 refills | Status: AC | PRN

## 2022-11-02 MED ORDER — DICLOFENAC SODIUM 1 % EX GEL: 4.0000 g | Freq: Four times a day (QID) | CUTANEOUS | 0 refills | Status: DC

## 2022-11-02 NOTE — ED Triage Notes (Signed)
Pt comes with c/o right shoulder pain that started this am pt denies any known injury. Pt denies any cp. Pt states it feels like its spasming

## 2022-11-02 NOTE — Discharge Instructions (Signed)
You can take Norco for pain. Please combine Norco with Zofran to avoid nausea. If you have find that you are constipated, please take MiraLAX. Please make follow-up appointment with Dr. Audelia Acton regarding shoulder pain.

## 2022-11-03 NOTE — ED Provider Notes (Signed)
[facility] Provider Note  Patient Contact: 12:01 AM (approximate)   History   Shoulder Pain   HPI  Shirley Knight is a 81 y.o. female presents to the emergency department with left shoulder pain that occurs when patient tries to stand up.  Patient states that she braces herself and experiences worsening pain when she tries overhead activities.  No other falls or mechanisms of trauma.      Physical Exam   Triage Vital Signs: ED Triage Vitals  Enc Vitals Group     BP 11/02/22 1811 (!) 152/77     Pulse Rate 11/02/22 1811 62     Resp 11/02/22 1811 17     Temp 11/02/22 1811 98.5 F (36.9 C)     Temp src --      SpO2 11/02/22 1811 100 %     Weight --      Height --      Head Circumference --      Peak Flow --      Pain Score 11/02/22 1810 6     Pain Loc --      Pain Edu? --      Excl. in [place]? --     Most recent vital signs: Vitals:   11/02/22 1811  BP: (!) 152/77  Pulse: 62  Resp: 17  Temp: 98.5 F (36.9 C)  SpO2: 100%     General: Alert and in no acute distress. Eyes:  PERRL. EOMI. Head: No acute traumatic findings ENT:      Nose: No congestion/rhinnorhea.      Mouth/Throat: Mucous membranes are moist. Neck: No stridor. No cervical spine tenderness to palpation. Cardiovascular:  Good peripheral perfusion Respiratory: Normal respiratory effort without tachypnea or retractions. Lungs CTAB. Good air entry to the bases with no decreased or absent breath sounds. Gastrointestinal: Bowel sounds 4 quadrants. Soft and nontender to palpation. No guarding or rigidity. No palpable masses. No distention. No CVA tenderness. Musculoskeletal: Patient has symmetric strength in the upper extremities.  Patient has right rotator cuff weakness with testing.  Palpable radial and ulnar pulses bilaterally and symmetrically. Neurologic:  No gross focal neurologic deficits are appreciated.  Skin:   No rash noted Other:   ED Results / Procedures /  Treatments   Labs (all labs ordered are listed, but only abnormal results are displayed) Labs Reviewed - No data to display      PROCEDURES:  Critical Care performed: No  Procedures   MEDICATIONS ORDERED IN ED: Medications - No data to display   IMPRESSION / MDM / ASSESSMENT AND PLAN / ED COURSE  I reviewed the triage vital signs and the nursing notes.                              Assessment and plan Shoulder pain 81 year old female presents to the emergency department with acute right shoulder pain.  Patient was hypertensive at triage but vital signs were otherwise reassuring.  On exam, patient was alert, active and nontoxic-appearing with right rotator cuff weakness with testing.  Patient was given a sling for comfort was prescribed a short course of Norco for pain.  She was advised to follow-up with orthopedics.  Return precautions were given to return with new or worsening symptoms.      FINAL CLINICAL IMPRESSION(S) / ED DIAGNOSES   Final diagnoses:  Acute pain of right shoulder     Rx / DC  Orders   ED Discharge Orders          Ordered    HYDROcodone-acetaminophen (NORCO) 5-325 MG tablet  Every 6 hours PRN        11/02/22 2046    ondansetron (ZOFRAN-ODT) 4 MG disintegrating tablet  Every 8 hours PRN        11/02/22 2046    diclofenac Sodium (VOLTAREN) 1 % GEL  4 times daily        11/02/22 2046             Note:  This document was prepared using Dragon voice recognition software and may include unintentional dictation errors.   Shirley Mau [place], PA-C 11/03/22 0012    Shirley Antis, MD 11/03/22 1949

## 2023-04-11 ENCOUNTER — Encounter: Payer: Self-pay | Admitting: Emergency Medicine

## 2023-04-11 ENCOUNTER — Other Ambulatory Visit: Payer: Self-pay

## 2023-04-11 ENCOUNTER — Emergency Department: Payer: 59

## 2023-04-11 ENCOUNTER — Emergency Department
Admission: EM | Admit: 2023-04-11 | Discharge: 2023-04-11 | Disposition: A | Discharge status: Home / Self Care | Payer: 59 | Attending: Emergency Medicine | Admitting: Emergency Medicine

## 2023-04-11 DIAGNOSIS — I10 Essential (primary) hypertension: Secondary | ICD-10-CM | POA: Diagnosis not present

## 2023-04-11 DIAGNOSIS — E039 Hypothyroidism, unspecified: Secondary | ICD-10-CM | POA: Insufficient documentation

## 2023-04-11 DIAGNOSIS — M5412 Radiculopathy, cervical region: Secondary | ICD-10-CM | POA: Diagnosis not present

## 2023-04-11 DIAGNOSIS — M542 Cervicalgia: Secondary | ICD-10-CM | POA: Diagnosis present

## 2023-04-11 MED ORDER — HYDROCODONE-ACETAMINOPHEN 5-325 MG PO TABS
1.0000 | ORAL_TABLET | Freq: Once | ORAL | Status: AC
  Administered 2023-04-11: 1 via ORAL
  Filled 2023-04-11: qty 1

## 2023-04-11 MED ORDER — PREDNISONE 10 MG (21) PO TBPK: ORAL_TABLET | ORAL | 0 refills | Status: DC

## 2023-04-11 NOTE — ED Notes (Signed)
Patients states awaken about 3 hours ago with left side of neck and back of head hurting. Describes as aching and hurts with movement. Has not taken anything for the pain. Rates pain an 8.

## 2023-04-11 NOTE — ED Triage Notes (Addendum)
Pt here with left side neck pain that started this morning. Pt states she is still able to turn her neck it just hurts a lot. Pt states the pain starts behind her ear radiating down her neck. Pt denies fall or injury. Pt alert and oriented in triage.

## 2023-04-11 NOTE — ED Provider Notes (Signed)
[facility] Provider Note    Event Date/Time   First MD Initiated Contact with Patient 04/11/23 [phone]     (approximate)   History   Neck Pain   HPI  Shirley Knight is a 82 y.o. female with history of hypertension, hypothyroidism, gout, and as listed in EMR presents to the emergency department for treatment and evaluation of left side neck pain.  Pain was not present last night before going to bed, but when she awakened this morning and tried to get up to go to the restroom it was very difficult and painful to turn her head side-to-side.  No known injury.  No alleviating measures attempted prior to arrival..      Physical Exam   Triage Vital Signs: ED Triage Vitals  Enc Vitals Group     BP 04/11/23 0917 (!) 149/82     Pulse Rate 04/11/23 0916 (!) 59     Resp 04/11/23 0916 16     Temp 04/11/23 0916 98.6 F (37 C)     Temp Source 04/11/23 0916 Oral     SpO2 04/11/23 0916 97 %     Weight 04/11/23 0916 160 lb 0.9 oz (72.6 kg)     Height 04/11/23 0916 5\' 2"  (1.575 m)     Head Circumference --      Peak Flow --      Pain Score 04/11/23 0930 8     Pain Loc --      Pain Edu? --      Excl. in [place]? --     Most recent vital signs: Vitals:   04/11/23 0916 04/11/23 0917  BP:  (!) 149/82  Pulse: (!) 59   Resp: 16   Temp: 98.6 F (37 C)   SpO2: 97%     General: Awake, no distress.  CV:  Good peripheral perfusion.  Resp:  Normal effort.  Abd:  No distention.  Other:  Diffuse tenderness over left lateral neck. No focal tenderness over cervical vertebrae.   ED Results / Procedures / Treatments   Labs (all labs ordered are listed, but only abnormal results are displayed) Labs Reviewed - No data to display   EKG  Not indicated.   RADIOLOGY  Image and radiology report reviewed and interpreted by me. Radiology report consistent with the same.  C4-5 degenerative left foraminal impingement.  PROCEDURES:  Critical Care performed:  No  Procedures   MEDICATIONS ORDERED IN ED:  Medications  HYDROcodone-acetaminophen (NORCO/VICODIN) 5-325 MG per tablet 1 tablet (1 tablet Oral Given 04/11/23 0947)     IMPRESSION / MDM / ASSESSMENT AND PLAN / ED COURSE   I have reviewed the triage note.  Differential diagnosis includes, but is not limited to, musculoskeletal strain,   Patient's presentation is most consistent with acute illness / injury with system symptoms.  A 83-year-old female presenting to the emergency department for treatment and evaluation of acute onset left-sided neck pain that started this morning.  Patient states that she is still able to move her neck but movement triggers the pain.  Pain starts behind her ear and radiates down the lateral aspect of her neck.  No known injury.  CT of the cervical spine does show C4-C5 degenerative changes with left foraminal impingement.  This seems consistent with the patient's pain.  She is having no other symptoms of concern.  Plan will be to treat her with a tapered prednisone pack.  She was also encouraged to follow-up with her  primary care provider if her symptoms are not improving over the next few days.  If any symptom changes or worsens and she is unable to schedule an appointment, she is to return to the emergency department.      FINAL CLINICAL IMPRESSION(S) / ED DIAGNOSES   Final diagnoses:  Cervical radiculopathy, acute     Rx / DC Orders   ED Discharge Orders          Ordered    predniSONE (STERAPRED UNI-PAK 21 TAB) 10 MG (21) TBPK tablet        04/11/23 1039             Note:  This document was prepared using Dragon voice recognition software and may include unintentional dictation errors.   Chinita Pester, FNP 04/13/23 1531    Shaune Pollack, MD 04/16/23 [phone]

## 2023-04-11 NOTE — ED Notes (Signed)
Patient transported to X-ray via WC. 

## 2023-06-11 ENCOUNTER — Emergency Department: Payer: 59

## 2023-06-11 ENCOUNTER — Other Ambulatory Visit: Payer: Self-pay

## 2023-06-11 ENCOUNTER — Emergency Department
Admission: EM | Admit: 2023-06-11 | Discharge: 2023-06-11 | Disposition: A | Discharge status: Home / Self Care | Payer: 59 | Source: Home / Self Care | Attending: Emergency Medicine | Admitting: Emergency Medicine

## 2023-06-11 DIAGNOSIS — M5412 Radiculopathy, cervical region: Secondary | ICD-10-CM | POA: Diagnosis not present

## 2023-06-11 DIAGNOSIS — I10 Essential (primary) hypertension: Secondary | ICD-10-CM | POA: Diagnosis not present

## 2023-06-11 DIAGNOSIS — M542 Cervicalgia: Secondary | ICD-10-CM | POA: Diagnosis present

## 2023-06-11 MED ORDER — LIDOCAINE 5 % EX PTCH: 1.0000 | MEDICATED_PATCH | Freq: Two times a day (BID) | CUTANEOUS | 0 refills | Status: AC

## 2023-06-11 MED ORDER — LIDOCAINE 5 % EX PTCH
1.0000 | MEDICATED_PATCH | CUTANEOUS | Status: DC
  Administered 2023-06-11: 1 via TRANSDERMAL
  Filled 2023-06-11: qty 1

## 2023-06-11 MED ORDER — PREDNISONE 10 MG (21) PO TBPK: ORAL_TABLET | ORAL | 0 refills | Status: DC

## 2023-06-11 MED ORDER — DEXAMETHASONE SODIUM PHOSPHATE 10 MG/ML IJ SOLN
10.0000 mg | Freq: Once | INTRAMUSCULAR | Status: AC
  Administered 2023-06-11: 10 mg via INTRAMUSCULAR
  Filled 2023-06-11: qty 1

## 2023-06-11 NOTE — Discharge Instructions (Signed)
You may take medications as prescribed for your symptoms today.  If your symptoms persist, you may schedule an appointment with the back doctor.  Please return for any new, worsening, or change in symptoms or other concerns.  It was a pleasure caring for you today.

## 2023-06-11 NOTE — ED Provider Notes (Signed)
[facility] Provider Note    Event Date/Time   First MD Initiated Contact with Patient 06/11/23 1034     (approximate)   History   Torticollis   HPI  Shirley Knight is a 82 y.o. female with a past medical history of hypertension, cervical radiculopathy, who presents today for evaluation of neck pain.  Patient reports that she has had this before.  She reports that her pain is the bottom of her neck and radiates to her shoulders.  She did not have any weakness in her arms, however she feels that the pain radiates down her left arm occasionally.  She has not had any fevers or chills.  She has not had any recent neck manipulations or injuries.  No headache, visual changes, tenderness, or trouble speaking or walking.  She has not taken anything at home for her symptoms.  There are no problems to display for this patient.         Physical Exam   Triage Vital Signs: ED Triage Vitals  Encounter Vitals Group     BP 06/11/23 1011 (!) 134/94     Systolic BP Percentile --      Diastolic BP Percentile --      Pulse Rate 06/11/23 1011 71     Resp 06/11/23 1011 16     Temp 06/11/23 1011 98.2 F (36.8 C)     Temp Source 06/11/23 1011 Oral     SpO2 06/11/23 1011 97 %     Weight 06/11/23 1012 165 lb (74.8 kg)     Height 06/11/23 1012 5' 2.5" (1.588 m)     Head Circumference --      Peak Flow --      Pain Score 06/11/23 1011 10     Pain Loc --      Pain Education --      Exclude from Growth Chart --     Most recent vital signs: Vitals:   06/11/23 1011  BP: (!) 134/94  Pulse: 71  Resp: 16  Temp: 98.2 F (36.8 C)  SpO2: 97%    Physical Exam Vitals and nursing note reviewed.  Constitutional:      General: Awake and alert. No acute distress.    Appearance: Normal appearance. The patient is normal weight.  HENT:     Head: Normocephalic and atraumatic.     Mouth: Mucous membranes are moist.  Eyes:     General: PERRL. Normal EOMs        Right  eye: No discharge.        Left eye: No discharge.     Conjunctiva/sclera: Conjunctivae normal.  Cardiovascular:     Rate and Rhythm: Normal rate and regular rhythm.     Pulses: Normal pulses.  Pulmonary:     Effort: Pulmonary effort is normal. No respiratory distress.     Breath sounds: Normal breath sounds.  Abdominal:     Abdomen is soft. There is no abdominal tenderness. No rebound or guarding. No distention. Musculoskeletal:        General: No swelling. Normal range of motion.     Cervical back: Normal range of motion and neck supple. No midline cervical spine tenderness.  Full range of motion of neck.  Positive Spurling test.  Negative Lhermitte sign.  Normal strength and sensation in bilateral upper extremities. Normal grip strength bilaterally.  Normal intrinsic muscle function of the hand bilaterally.  Normal radial pulses bilaterally. Skin:    General: Skin  is warm and dry.     Capillary Refill: Capillary refill takes less than 2 seconds.     Findings: No rash.  Neurological:     Mental Status: The patient is awake and alert.      ED Results / Procedures / Treatments   Labs (all labs ordered are listed, but only abnormal results are displayed) Labs Reviewed - No data to display   EKG     RADIOLOGY I independently reviewed and interpreted imaging and agree with radiologists findings.     PROCEDURES:  Critical Care performed:   Procedures   MEDICATIONS ORDERED IN ED: Medications  lidocaine (LIDODERM) 5 % 1 patch (1 patch Transdermal Patch Applied 06/11/23 1158)  dexamethasone (DECADRON) injection 10 mg (10 mg Intramuscular Given 06/11/23 1202)     IMPRESSION / MDM / ASSESSMENT AND PLAN / ED COURSE  I reviewed the triage vital signs and the nursing notes.   Differential diagnosis includes, but is not limited to, cervical radiculopathy, cervical arthritis, less likely fracture or cord injury.  I reviewed the patient's chart.  Patient was seen in the  emergency department on 04/11/2023 with left-sided neck pain and she had a CT of her cervical spine at that time which revealed C4-C5 degenerative changes with left foraminal impingement.  She was treated symptomatically with improvement of her symptoms.  Patient is awake and alert, hemodynamically stable and afebrile.  He has normal strength and sensation of bilateral upper extremities, normal grip strength bilaterally, not consistent with central cord syndrome.  No neurological deficits.  Positive Spurling test to the left is consistent with cervical radiculopathy.  Her CT scan reveals degenerative changes and anterolisthesis of C4 and C5.  She was treated with Lidoderm patch and Decadron with significant improvement of her symptoms.  She was started on a prednisone Dosepak and also given Lidoderm patches to use at home.  We discussed return precautions and the importance of close outpatient follow-up.  She was given the information for spine surgery to help with her symptoms and continue management as indicated.  Patient and her family members understand and agree with plan.  She was discharged in stable condition.   Patient's presentation is most consistent with acute complicated illness / injury requiring diagnostic workup.   FINAL CLINICAL IMPRESSION(S) / ED DIAGNOSES   Final diagnoses:  Cervical radiculopathy     Rx / DC Orders   ED Discharge Orders          Ordered    lidocaine (LIDODERM) 5 %  Every 12 hours        06/11/23 1220    predniSONE (STERAPRED UNI-PAK 21 TAB) 10 MG (21) TBPK tablet        06/11/23 1220             Note:  This document was prepared using Dragon voice recognition software and may include unintentional dictation errors.   Keturah Shavers 06/11/23 1332    Shaune Pollack, MD 06/11/23 1945

## 2023-06-11 NOTE — ED Triage Notes (Signed)
Pt states coming in with neck pain and stiff that started last night, but when she woke up it was worse. Pt states no known trauma, but a history of this, but cannot recall what she was diagnosed with. Pt states the pain comes in waves

## 2023-06-22 ENCOUNTER — Other Ambulatory Visit: Payer: Self-pay

## 2023-06-22 ENCOUNTER — Emergency Department
Admission: EM | Admit: 2023-06-22 | Discharge: 2023-06-22 | Disposition: A | Discharge status: Home / Self Care | Payer: 59 | Attending: Emergency Medicine | Admitting: Emergency Medicine

## 2023-06-22 DIAGNOSIS — L244 Irritant contact dermatitis due to drugs in contact with skin: Secondary | ICD-10-CM | POA: Diagnosis not present

## 2023-06-22 DIAGNOSIS — L249 Irritant contact dermatitis, unspecified cause: Secondary | ICD-10-CM | POA: Diagnosis present

## 2023-06-22 MED ORDER — BACITRACIN ZINC 500 UNIT/GM EX OINT
TOPICAL_OINTMENT | Freq: Every day | CUTANEOUS | Status: DC
  Administered 2023-06-22: 1 via TOPICAL
  Filled 2023-06-22: qty 0.9

## 2023-06-22 MED ORDER — TRIAMCINOLONE ACETONIDE 0.1 % EX OINT: 1.0000 | TOPICAL_OINTMENT | Freq: Two times a day (BID) | CUTANEOUS | 1 refills | Status: AC

## 2023-06-22 NOTE — ED Provider Notes (Signed)
[facility] Emergency Department Provider Note     Event Date/Time   First MD Initiated Contact with Patient 06/22/23 1840     (approximate)   History   Headache, Neck Pain, and Back Pain   HPI  Shirley Knight is a 82 y.o. female with a noncontributory medical history, presents to the ED for evaluation of posterior neck and head skin irritation.  Patient was evaluated here in the ED a few days prior, for radicular cervical pain.  She was given prescriptions for steroids and a Lidoderm patch was placed at that time.  Patient admits that she only remove the lidocaine patch last night.  Noting some local skin irritation in the same area.  She denies any fevers, chills, or sweats.  She presents to the ED at this time noting some bumps and irritation to the nape and occiput.  She denies any associated pruritic drainage.  He notes that the symptoms related to the cervical declot they are improved.  Physical Exam   Triage Vital Signs: ED Triage Vitals  Encounter Vitals Group     BP 06/22/23 1717 (!) 143/74     Systolic BP Percentile --      Diastolic BP Percentile --      Pulse Rate 06/22/23 1717 77     Resp 06/22/23 1717 16     Temp 06/22/23 1717 98.7 F (37.1 C)     Temp Source 06/22/23 1717 Oral     SpO2 06/22/23 1717 100 %     Weight 06/22/23 1718 152 lb (68.9 kg)     Height 06/22/23 1718 5\' 2"  (1.575 m)     Head Circumference --      Peak Flow --      Pain Score 06/22/23 1718 6     Pain Loc --      Pain Education --      Exclude from Growth Chart --     Most recent vital signs: Vitals:   06/22/23 1717 06/22/23 1932  BP: (!) 143/74 138/77  Pulse: 77 72  Resp: 16 16  Temp: 98.7 F (37.1 C)   SpO2: 100% 95%    General Awake, no distress. NAD HEENT NCAT. PERRL. EOMI. No rhinorrhea. Mucous membranes are moist.  CV:  Good peripheral perfusion.  RESP:  Normal effort.  ABD:  No distention.  SKIN:  With local irritation and erythematous  papules noted to the occiput and nape of the neck consistent with contact of otitis.  The distribution is in the same rectangular shape as a Lidoderm patch.   ED Results / Procedures / Treatments   Labs (all labs ordered are listed, but only abnormal results are displayed) Labs Reviewed - No data to display   EKG   RADIOLOGY  No results found.   PROCEDURES:  Critical Care performed: No  Procedures   MEDICATIONS ORDERED IN ED: Medications  bacitracin ointment (1 Application Topical Given 06/22/23 1910)     IMPRESSION / MDM / ASSESSMENT AND PLAN / ED COURSE  I reviewed the triage vital signs and the nursing notes.                              Differential diagnosis includes, but is not limited to, contact Derm, folliculitis, eczema exacerbation  Patient's presentation is most consistent with acute, uncomplicated illness.  Patient's diagnosis is consistent with contact dermatitis. Patient will be discharged home with  prescriptions for triamcinolone ointment. Patient is to follow up with her primary provider as needed or otherwise directed. Patient is given ED precautions to return to the ED for any worsening or new symptoms.     FINAL CLINICAL IMPRESSION(S) / ED DIAGNOSES   Final diagnoses:  Irritant contact dermatitis due to drug in contact with skin     Rx / DC Orders   ED Discharge Orders          Ordered    triamcinolone ointment (KENALOG) 0.1 %  2 times daily        06/22/23 1903             Note:  This document was prepared using Dragon voice recognition software and may include unintentional dictation errors.    Lissa Hoard, PA-C 06/22/23 1942    Jene Every, MD 06/22/23 2040

## 2023-06-22 NOTE — ED Triage Notes (Signed)
Pt arrives via POV w/ c/o HA, Neck pain/stiffness, and upper back pain that she reports started this am. Denies injuries.

## 2023-06-22 NOTE — Discharge Instructions (Signed)
Use the prescription steroid cream 2-3 times daily.  Avoid over heating or sweating as this might increase itching and irritation.  He may consider taking OTC allergy medicine like Claritin, Zyrtec, Allegra, or Benadryl for additional itch relief.

## 2023-08-11 ENCOUNTER — Emergency Department: Payer: 59

## 2023-08-11 ENCOUNTER — Emergency Department
Admission: EM | Admit: 2023-08-11 | Discharge: 2023-08-11 | Disposition: A | Discharge status: Home / Self Care | Payer: 59 | Attending: Emergency Medicine | Admitting: Emergency Medicine

## 2023-08-11 ENCOUNTER — Other Ambulatory Visit: Payer: Self-pay

## 2023-08-11 DIAGNOSIS — M25462 Effusion, left knee: Secondary | ICD-10-CM | POA: Insufficient documentation

## 2023-08-11 DIAGNOSIS — G8929 Other chronic pain: Secondary | ICD-10-CM | POA: Diagnosis not present

## 2023-08-11 DIAGNOSIS — M25562 Pain in left knee: Secondary | ICD-10-CM | POA: Diagnosis present

## 2023-08-11 NOTE — ED Triage Notes (Signed)
Pt to ED for left knee pain for 2.5 weeks. Denies known injuries. States think has arthritis. Reports right knee hurts too but mostly left right now.

## 2023-08-11 NOTE — ED Provider Notes (Signed)
[facility] Emergency Department Provider Note     Event Date/Time   First MD Initiated Contact with Patient 08/11/23 1545     (approximate)   History   Knee Pain   HPI  Shirley Knight is a 82 y.o. female presents to the ED with complaint of left knee pain x 3 weeks.  Patient has a history of chronic knee pain.  She denies trauma or injury. She reports taken aspirin for pain with minimal relief.  Patient is ambulatory with the assistance of a cane.  Pain is worse with range of motion.  Patient denies numbness and weakness.  No fevers.    Physical Exam   Triage Vital Signs: ED Triage Vitals  Encounter Vitals Group     BP 08/11/23 1414 (!) 126/93     Systolic BP Percentile --      Diastolic BP Percentile --      Pulse Rate 08/11/23 1415 83     Resp 08/11/23 1414 20     Temp 08/11/23 1414 98.5 F (36.9 C)     Temp src --      SpO2 08/11/23 1415 100 %     Weight 08/11/23 1416 160 lb (72.6 kg)     Height 08/11/23 1416 5\' 2"  (1.575 m)     Head Circumference --      Peak Flow --      Pain Score 08/11/23 1415 10     Pain Loc --      Pain Education --      Exclude from Growth Chart --     Most recent vital signs: Vitals:   08/11/23 1415 08/11/23 1709  BP:  95/64  Pulse: 83 82  Resp:  18  Temp:    SpO2: 100% 100%    General: Well appearing. Alert and oriented. INAD.  CV:  Good peripheral perfusion. RRR. No peripheral edema.  RESP:  Normal effort. LCTAB. No retractions.  ABD:  No distention. MSK:   Full ROM in all joints. No swelling, deformity or tenderness.  NEURO: Cranial nerves  intact. No focal deficits. Sensation and motor function intact. 5/5 muscle strength of UE & LE. Gait is steady with assistance of cane.  OTHER:  Left knee reveals edema.  No visible deformity.  No erythema or warmth.  Full AROM limited due to pain.  Neurovascular status intact.     ED Results / Procedures / Treatments   Labs (all labs ordered are listed,  but only abnormal results are displayed) Labs Reviewed - No data to display  RADIOLOGY  I personally viewed and evaluated these images as part of my medical decision making, as well as reviewing the written report by the radiologist.  ED Provider Interpretation: Left knee reveals no bony abnormality.  Pending final read from radiologist.  DG Knee Complete 4 Views Left  Result Date: 08/11/2023 CLINICAL DATA:  Left knee pain. EXAM: LEFT KNEE - COMPLETE 4+ VIEW COMPARISON:  None Available. FINDINGS: Mild tricompartmental joint space narrowing. Mild to moderate tricompartmental peripheral spurring and spurring of the tibial spines. There is a moderate knee joint effusion. No fracture. No erosion or focal bone abnormality. No focal soft tissue abnormalities. 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 Electronically Signed   By: Narda Rutherford M.D.   On: 08/11/2023 15:50    PROCEDURES:  Critical Care performed: No  Procedures   MEDICATIONS ORDERED IN ED: Medications - No data to display   IMPRESSION /  MDM / ASSESSMENT AND PLAN / ED COURSE  I reviewed the triage vital signs and the nursing notes.                               82 y.o. female presents to the emergency department for evaluation and treatment of chronic left knee pain with increase in intensity over the last few days. See HPI for further details.   Differential diagnosis includes, but is not limited to fracture, dislocation, effusion, bursitis, septic joint  Patient's presentation is most consistent with acute complicated illness / injury requiring diagnostic workup.  Patient is alert and oriented.  She is hemodynamically stable.  Physical exam findings reveal left knee edema.  There are no clinical findings of systemic symptoms.  The absence of erythema and/or warmth to the area decreases my suspicion for a septic joint.  X-ray obtained in triage reveals that there is a mild  to moderate tricompartmental osteoarthritis with moderate joint effusion.  Patient is given a thorough discussion about pain management with RICE therapy and alternating Tylenol and ibuprofen..  I do feel patient is in stable condition for discharge home and outpatient follow-up.  Patient is advised to follow-up with orthopedics. ED precautions discussed. Patient verbalizes understanding. All questions and concerns were addressed during ED visit.     FINAL CLINICAL IMPRESSION(S) / ED DIAGNOSES   Final diagnoses:  Effusion of left knee  Chronic pain of left knee   Rx / DC Orders   ED Discharge Orders     None       Note:  This document was prepared using Dragon voice recognition software and may include unintentional dictation errors.    Romeo Apple, Tajae Maiolo A, PA-C 08/11/23 1821    Jene Every, MD 08/11/23 2022

## 2023-08-11 NOTE — Discharge Instructions (Signed)
You were evaluated in the ED for left knee pain and swelling.  Your x-ray revealed moderate amount of fluid buildup.  You will need to take Aleve (ibuprofen) and Tylenol for pain.  Please follow RICE therapy that we discussed.  This includes resting, applying ice and keeping your leg elevated especially at night.  Follow-up with orthopedics for further evaluation and management.

## 2023-10-06 ENCOUNTER — Encounter: Payer: Self-pay | Admitting: Emergency Medicine

## 2023-10-06 ENCOUNTER — Observation Stay: Payer: 59

## 2023-10-06 ENCOUNTER — Observation Stay
Admission: EM | Admit: 2023-10-06 | Discharge: 2023-10-09 | Disposition: A | Discharge status: Home / Self Care | Payer: 59 | Attending: Internal Medicine | Admitting: Internal Medicine

## 2023-10-06 ENCOUNTER — Other Ambulatory Visit: Payer: Self-pay

## 2023-10-06 DIAGNOSIS — F03B0 Unspecified dementia, moderate, without behavioral disturbance, psychotic disturbance, mood disturbance, and anxiety: Secondary | ICD-10-CM | POA: Diagnosis not present

## 2023-10-06 DIAGNOSIS — Z7982 Long term (current) use of aspirin: Secondary | ICD-10-CM | POA: Diagnosis not present

## 2023-10-06 DIAGNOSIS — M109 Gout, unspecified: Secondary | ICD-10-CM | POA: Diagnosis present

## 2023-10-06 DIAGNOSIS — I129 Hypertensive chronic kidney disease with stage 1 through stage 4 chronic kidney disease, or unspecified chronic kidney disease: Secondary | ICD-10-CM | POA: Insufficient documentation

## 2023-10-06 DIAGNOSIS — E871 Hypo-osmolality and hyponatremia: Secondary | ICD-10-CM | POA: Diagnosis not present

## 2023-10-06 DIAGNOSIS — F17210 Nicotine dependence, cigarettes, uncomplicated: Secondary | ICD-10-CM | POA: Insufficient documentation

## 2023-10-06 DIAGNOSIS — R2681 Unsteadiness on feet: Secondary | ICD-10-CM | POA: Diagnosis not present

## 2023-10-06 DIAGNOSIS — R001 Bradycardia, unspecified: Secondary | ICD-10-CM | POA: Diagnosis not present

## 2023-10-06 DIAGNOSIS — I4891 Unspecified atrial fibrillation: Secondary | ICD-10-CM | POA: Insufficient documentation

## 2023-10-06 DIAGNOSIS — E876 Hypokalemia: Secondary | ICD-10-CM | POA: Insufficient documentation

## 2023-10-06 DIAGNOSIS — I10 Essential (primary) hypertension: Secondary | ICD-10-CM | POA: Diagnosis present

## 2023-10-06 DIAGNOSIS — E039 Hypothyroidism, unspecified: Secondary | ICD-10-CM | POA: Diagnosis present

## 2023-10-06 DIAGNOSIS — R634 Abnormal weight loss: Secondary | ICD-10-CM | POA: Diagnosis not present

## 2023-10-06 DIAGNOSIS — R1311 Dysphagia, oral phase: Secondary | ICD-10-CM | POA: Insufficient documentation

## 2023-10-06 DIAGNOSIS — Z79899 Other long term (current) drug therapy: Secondary | ICD-10-CM | POA: Diagnosis not present

## 2023-10-06 DIAGNOSIS — I951 Orthostatic hypotension: Secondary | ICD-10-CM

## 2023-10-06 DIAGNOSIS — R55 Syncope and collapse: Secondary | ICD-10-CM | POA: Diagnosis not present

## 2023-10-06 DIAGNOSIS — F1010 Alcohol abuse, uncomplicated: Secondary | ICD-10-CM

## 2023-10-06 DIAGNOSIS — N1831 Chronic kidney disease, stage 3a: Secondary | ICD-10-CM | POA: Insufficient documentation

## 2023-10-06 DIAGNOSIS — E785 Hyperlipidemia, unspecified: Secondary | ICD-10-CM | POA: Diagnosis present

## 2023-10-06 DIAGNOSIS — D72829 Elevated white blood cell count, unspecified: Secondary | ICD-10-CM | POA: Diagnosis not present

## 2023-10-06 LAB — MAGNESIUM: Magnesium: 1.8 mg/dL (ref 1.7–2.4)

## 2023-10-06 LAB — PHOSPHORUS: Phosphorus: 3.4 mg/dL (ref 2.5–4.6)

## 2023-10-06 LAB — BASIC METABOLIC PANEL
Anion gap: 14 (ref 5–15)
BUN: 18 mg/dL (ref 8–23)
CO2: 23 mmol/L (ref 22–32)
Calcium: 9.5 mg/dL (ref 8.9–10.3)
Chloride: 95 mmol/L — ABNORMAL LOW (ref 98–111)
Creatinine, Ser: 1.18 mg/dL — ABNORMAL HIGH (ref 0.44–1.00)
GFR, Estimated: 47 mL/min — ABNORMAL LOW (ref >60)
Glucose, Bld: 107 mg/dL — ABNORMAL HIGH (ref 70–99)
Potassium: 2.9 mmol/L — ABNORMAL LOW (ref 3.5–5.1)
Sodium: 132 mmol/L — ABNORMAL LOW (ref 135–145)

## 2023-10-06 LAB — CBC
HCT: 37.2 % (ref 36.0–46.0)
Hemoglobin: 12.2 g/dL (ref 12.0–15.0)
MCH: 30.2 pg (ref 26.0–34.0)
MCHC: 32.8 g/dL (ref 30.0–36.0)
MCV: 92.1 fL (ref 80.0–100.0)
Platelets: 240 10*3/uL (ref 150–400)
RBC: 4.04 MIL/uL (ref 3.87–5.11)
RDW: 14.2 % (ref 11.5–15.5)
WBC: 11 10*3/uL — ABNORMAL HIGH (ref 4.0–10.5)
nRBC: 0 % (ref 0.0–0.2)

## 2023-10-06 LAB — TROPONIN I (HIGH SENSITIVITY)
Troponin I (High Sensitivity): 6 ng/L (ref <18)
Troponin I (High Sensitivity): 7 ng/L (ref <18)

## 2023-10-06 LAB — TSH: TSH: 1.678 u[IU]/mL (ref 0.350–4.500)

## 2023-10-06 MED ORDER — SODIUM CHLORIDE 0.9 % IV BOLUS
1000.0000 mL | Freq: Once | INTRAVENOUS | Status: AC
  Administered 2023-10-07: 1000 mL via INTRAVENOUS

## 2023-10-06 MED ORDER — LEVOTHYROXINE SODIUM 50 MCG PO TABS
75.0000 ug | ORAL_TABLET | Freq: Every day | ORAL | Status: DC
  Administered 2023-10-07 – 2023-10-09 (×3): 75 ug via ORAL
  Filled 2023-10-06 (×2): qty 2
  Filled 2023-10-06: qty 1

## 2023-10-06 MED ORDER — SODIUM CHLORIDE 0.9 % IV BOLUS
1000.0000 mL | Freq: Once | INTRAVENOUS | Status: AC
  Administered 2023-10-06: 1000 mL via INTRAVENOUS

## 2023-10-06 MED ORDER — NICOTINE 21 MG/24HR TD PT24
21.0000 mg | MEDICATED_PATCH | Freq: Every day | TRANSDERMAL | Status: DC
  Administered 2023-10-07 – 2023-10-09 (×3): 21 mg via TRANSDERMAL
  Filled 2023-10-06 (×3): qty 1

## 2023-10-06 MED ORDER — POTASSIUM CHLORIDE 10 MEQ/100ML IV SOLN
10.0000 meq | INTRAVENOUS | Status: AC
  Administered 2023-10-07 (×2): 10 meq via INTRAVENOUS
  Filled 2023-10-06: qty 100

## 2023-10-06 MED ORDER — ACETAMINOPHEN 325 MG PO TABS
650.0000 mg | ORAL_TABLET | Freq: Four times a day (QID) | ORAL | Status: DC | PRN
  Administered 2023-10-08: 650 mg via ORAL
  Filled 2023-10-06 (×2): qty 2

## 2023-10-06 MED ORDER — HYDRALAZINE HCL 20 MG/ML IJ SOLN: 5.0000 mg | INTRAMUSCULAR | Status: DC | PRN

## 2023-10-06 MED ORDER — ATORVASTATIN CALCIUM 10 MG PO TABS
10.0000 mg | ORAL_TABLET | Freq: Every day | ORAL | Status: DC
  Administered 2023-10-07 – 2023-10-09 (×3): 10 mg via ORAL
  Filled 2023-10-06 (×3): qty 1

## 2023-10-06 MED ORDER — PANTOPRAZOLE SODIUM 40 MG PO TBEC
40.0000 mg | DELAYED_RELEASE_TABLET | Freq: Every day | ORAL | Status: DC
  Administered 2023-10-07 – 2023-10-09 (×3): 40 mg via ORAL
  Filled 2023-10-06 (×3): qty 1

## 2023-10-06 MED ORDER — POTASSIUM CHLORIDE CRYS ER 20 MEQ PO TBCR
40.0000 meq | EXTENDED_RELEASE_TABLET | ORAL | Status: AC
  Administered 2023-10-06 – 2023-10-07 (×2): 40 meq via ORAL
  Filled 2023-10-06 (×2): qty 2

## 2023-10-06 MED ORDER — ONDANSETRON HCL 4 MG/2ML IJ SOLN: 4.0000 mg | Freq: Three times a day (TID) | INTRAMUSCULAR | Status: DC | PRN

## 2023-10-06 MED ORDER — ASPIRIN 81 MG PO TBEC
81.0000 mg | DELAYED_RELEASE_TABLET | Freq: Every day | ORAL | Status: DC
  Administered 2023-10-07 – 2023-10-09 (×3): 81 mg via ORAL
  Filled 2023-10-06 (×3): qty 1

## 2023-10-06 MED ORDER — ALLOPURINOL 100 MG PO TABS
100.0000 mg | ORAL_TABLET | Freq: Every day | ORAL | Status: DC
  Administered 2023-10-07 – 2023-10-09 (×3): 100 mg via ORAL
  Filled 2023-10-06 (×3): qty 1

## 2023-10-06 NOTE — ED Triage Notes (Signed)
Pt to ED via [facility], pt was in line a [employer] checking out when other customers noticed she started to slump over. EMS reports that customers assisted pt to the ground. Pt did not fall or hit her head. Pt did not have radial pulse on EMS arrival and they were not able to get BP. EMS reports once they got pt in the truck she was was nauseated and lethargic on EMS arrival. Pt is more alert now. Pt denies chest pain. Pt is in NAD.

## 2023-10-06 NOTE — ED Notes (Signed)
Pt's sister Cindie Laroche called to say she believes the patient needs to be admitted d/t poor nutrition.  Explained to her that pt admission is dependent on physician decision when there is a medical reason for the patient to stay.  Ms. Anne Hahn states there are no known keys to the patient's home as they were lost today and she cannot go back to her home tonight.  Also states she cannot come to her house because she doesn't drive at night and doesn't have room for her at her home.  Reports they will be able to get the pt home in the morning but no earlier.

## 2023-10-06 NOTE — H&P (Incomplete)
History and Physical    Shirley Knight ZDG:[id number] DOB: [birth year] DOA: 10/06/2023  Referring MD/NP/PA:   PCP: Emogene Morgan, MD   Patient coming from:  The patient is coming from home.     Chief Complaint:  near syncope  HPI: Shirley Knight is a 82 y.o. female with medical history significant of HTN, HLD, CKD-3a, gout, hypothyroidism, who presents with near syncope.  Pt states that she suddenly collapsed when she was waiting in line at a store. Before the event, she was feeling weak and lightheaded for about 5 to 10 minutes.  The patient states that she did not completely lose consciousness and remembers everything.  She moves all extremities normally, no facial droop or slurred speech.  Patient denies chest pain, palpitation, cough, SOB.  She has nausea, no vomiting, diarrhea or abdominal pain.  No symptoms of UTI.  EMS reports that customers assisted pt to the ground. Pt did not fall or hit her head. EKG showed atrial fibrillation with RVR, with heart rate 143 in ED. When I saw pt in ED, her HR is 50-60s.   Data reviewed independently and ED Course: pt was found to have WBC 11.0, potassium 2.9, troponin level 6  --> 7, slightly worsening renal function, temperature normal, soft blood pressure 91/73, RR 18, oxygen saturation 98% on room air.  Patient is placed on telemetry bed for observation.   EKG: I have personally reviewed.  First EKG showed sinus rhythm, QTc 485, LAE, low voltage in lead III/aVF.  The repeated EKG showed A-fib, heart rate 143, early R wave progression.   Review of Systems:   General: no fevers, chills, no body weight gain, has fatigue HEENT: no blurry vision, hearing changes or sore throat Respiratory: no dyspnea, coughing, wheezing CV: no chest pain, no palpitations GI: has nausea, no vomiting, abdominal pain, diarrhea, constipation GU: no dysuria, burning on urination, increased urinary frequency, hematuria  Ext: no leg edema Neuro: no unilateral  weakness, numbness, or tingling, no vision change or hearing loss. Has lightheadedness, near syncope Skin: no rash, no skin tear. MSK: No muscle spasm, no deformity, no limitation of range of movement in spin Heme: No easy bruising.  Travel history: No recent long distant travel.   Allergy:  Allergies  Allergen Reactions   Celecoxib Other (See Comments)   Penicillins Itching and Rash    Past Medical History:  Diagnosis Date   Arthritis    GERD (gastroesophageal reflux disease)    Gout    Hyperlipidemia    Hypertension    Hypothyroidism    Rotator cuff tear, left    Wears dentures    partial upper    Past Surgical History:  Procedure Laterality Date   CATARACT EXTRACTION W/PHACO Left 09/12/2017   Procedure: CATARACT EXTRACTION PHACO AND INTRAOCULAR LENS PLACEMENT (IOC)  LEFT;  Surgeon: Nevada Crane, MD;  Location: [facility];  Service: Ophthalmology;  Laterality: Left;   CATARACT EXTRACTION W/PHACO Right 10/03/2017   Procedure: CATARACT EXTRACTION PHACO AND INTRAOCULAR LENS PLACEMENT (IOC)  RIGHT;  Surgeon: Nevada Crane, MD;  Location: [facility];  Service: Ophthalmology;  Laterality: Right;   CHOLECYSTECTOMY     KNEE SURGERY Left 05/09/2014   [facility] - Dr. Elfredia Nevins    Social History:  reports that she has been smoking cigarettes. She has never used smokeless tobacco. She reports current alcohol use. She reports that she does not use drugs.  Family History:  Family History  Problem Relation  Age of Onset   Breast cancer Mother 39   Breast cancer Sister      Prior to Admission medications   Medication Sig Start Date End Date Taking? Authorizing Provider  allopurinol (ZYLOPRIM) 100 MG tablet Take 100 mg by mouth daily.    [provider]  amLODipine (NORVASC) 5 MG tablet Take 5 mg by mouth daily.    [provider]  aspirin 81 MG tablet Take 81 mg by mouth daily.    [provider]  atorvastatin (LIPITOR) 10 MG  tablet Take 10 mg by mouth daily.    [provider]  CALCIUM PO Take by mouth daily.    [provider]  Cholecalciferol (VITAMIN D3) 2000 units TABS Take by mouth daily.    [provider]  diclofenac Sodium (VOLTAREN) 1 % GEL Apply 4 g topically 4 (four) times daily. 11/02/22   Pia Mau M, PA-C  Eluxadoline 100 MG TABS Take by mouth daily.    [provider]  levothyroxine (SYNTHROID, LEVOTHROID) 88 MCG tablet Take 88 mcg by mouth daily before breakfast.    [provider]  metoprolol-hydrochlorothiazide (LOPRESSOR HCT) 100-25 MG tablet Take 1 tablet by mouth daily.    [provider]  Omega-3 Fatty Acids (FISH OIL) 1000 MG CPDR Take by mouth daily.    [provider]  omeprazole (PRILOSEC) 40 MG capsule Take 40 mg by mouth daily.    [provider]  predniSONE (STERAPRED UNI-PAK 21 TAB) 10 MG (21) TBPK tablet Take 6 tablets on the first day and decrease by 1 tablet each day until finished. 04/11/23   Triplett, Cari B, FNP  predniSONE (STERAPRED UNI-PAK 21 TAB) 10 MG (21) TBPK tablet Take 6 tablets on day 1 and decrease by 1 tablet for each subsequent day 06/11/23   Poggi, Eileen Stanford E, PA-C  triamcinolone ointment (KENALOG) 0.1 % Apply 1 Application topically 2 (two) times daily. Back of neck 06/22/23   Menshew, Charlesetta Ivory, PA-C    Physical Exam: Vitals:   10/06/23 1730 10/06/23 2000 10/06/23 2030 10/06/23 2215  BP: 109/62 102/64 103/61 (!) 108/58  Pulse: 60 (!) 59 (!) 56 60  Resp: 18 15 13 15   Temp:    98 F (36.7 C)  TempSrc:    Oral  SpO2: 100% 97% 98% 98%  Weight:      Height:       General: Not in acute distress HEENT:       Eyes: PERRL, EOMI, no jaundice       ENT: No discharge from the ears and nose, no pharynx injection, no tonsillar enlargement.        Neck: No JVD, no bruit, no mass felt. Heme: No neck lymph node enlargement. Cardiac: S1/S2, RRR, No murmurs, No gallops or rubs. Respiratory: No  rales, wheezing, rhonchi or rubs. GI: Soft, nondistended, nontender, no rebound pain, no organomegaly, BS present. GU: No hematuria Ext: No pitting leg edema bilaterally. 1+DP/PT pulse bilaterally. Musculoskeletal: No joint deformities, No joint redness or warmth, no limitation of ROM in spin. Skin: No rashes.  Neuro: Alert, oriented X3, cranial nerves II-XII grossly intact, moves all extremities normally.  Psych: Patient is not psychotic, no suicidal or hemocidal ideation.  Labs on Admission: I have personally reviewed following labs and imaging studies  CBC: Recent Labs  Lab 10/06/23 1754  WBC 11.0*  HGB 12.2  HCT 37.2  MCV 92.1  PLT 240   Basic Metabolic Panel: Recent Labs  Lab  10/06/23 1754  NA 132*  K 2.9*  CL 95*  CO2 23  GLUCOSE 107*  BUN 18  CREATININE 1.18*  CALCIUM 9.5   GFR: Estimated Creatinine Clearance: 35.5 mL/min (A) (by C-G formula based on SCr of 1.18 mg/dL (H)). Liver Function Tests: No results for input(s): "AST", "ALT", "ALKPHOS", "BILITOT", "PROT", "ALBUMIN" in the last 168 hours. No results for input(s): "LIPASE", "AMYLASE" in the last 168 hours. No results for input(s): "AMMONIA" in the last 168 hours. Coagulation Profile: No results for input(s): "INR", "PROTIME" in the last 168 hours. Cardiac Enzymes: No results for input(s): "CKTOTAL", "CKMB", "CKMBINDEX", "TROPONINI" in the last 168 hours. BNP (last 3 results) No results for input(s): "PROBNP" in the last 8760 hours. HbA1C: No results for input(s): "HGBA1C" in the last 72 hours. CBG: No results for input(s): "GLUCAP" in the last 168 hours. Lipid Profile: No results for input(s): "CHOL", "HDL", "LDLCALC", "TRIG", "CHOLHDL", "LDLDIRECT" in the last 72 hours. Thyroid Function Tests: No results for input(s): "TSH", "T4TOTAL", "FREET4", "T3FREE", "THYROIDAB" in the last 72 hours. Anemia Panel: No results for input(s): "VITAMINB12", "FOLATE", "FERRITIN", "TIBC", "IRON", "RETICCTPCT" in the  last 72 hours. Urine analysis:    Component Value Date/Time   COLORURINE STRAW (A) 06/01/2021 1945   APPEARANCEUR CLEAR (A) 06/01/2021 1945   LABSPEC 1.004 (L) 06/01/2021 1945   PHURINE 7.0 06/01/2021 1945   GLUCOSEU NEGATIVE 06/01/2021 1945   HGBUR NEGATIVE 06/01/2021 1945   BILIRUBINUR NEGATIVE 06/01/2021 1945   KETONESUR NEGATIVE 06/01/2021 1945   PROTEINUR NEGATIVE 06/01/2021 1945   NITRITE NEGATIVE 06/01/2021 1945   LEUKOCYTESUR NEGATIVE 06/01/2021 1945   Sepsis Labs: @LABRCNTIP (procalcitonin:4,lacticidven:4) )No results found for this or any previous visit (from the past 240 hour(s)).   Radiological Exams on Admission: No results found.    Assessment/Plan Principal Problem:   Near syncope Active Problems:   New onset atrial fibrillation (HCC)   Hypothyroidism   Hyperlipidemia   HTN (hypertension)   Chronic kidney disease, stage 3a (HCC)   Hypokalemia   Gout   Leukocytosis   Assessment and Plan:   Principal Problem:   Near syncope Active Problems:   New onset atrial fibrillation (HCC)   Hypothyroidism   Hyperlipidemia   HTN (hypertension)   Chronic kidney disease, stage 3a (HCC)   Hypokalemia   Gout   Leukocytosis    DVT ppx: SQ Heparin         SQ Lovenox ***  Code Status: Full code   ***  Family Communication: not done, no family member is at bed side.         Disposition Plan:  Anticipate discharge back to previous environment  Consults called:  none  Admission status and Level of care: Telemetry Medical:    for obs     Dispo: The patient is from: Home              Anticipated d/c is to: Home              Anticipated d/c date is: 1 day              Patient currently is not medically stable to d/c.    Severity of Illness:  The appropriate patient status for this patient is OBSERVATION. Observation status is judged to be reasonable and necessary in order to provide the required intensity of service to ensure the patient's safety. The  patient's presenting symptoms, physical exam findings, and initial radiographic and laboratory data in the context of their  medical condition is felt to place them at decreased risk for further clinical deterioration. Furthermore, it is anticipated that the patient will be medically stable for discharge from the hospital within 2 midnights of admission.        Date of Service 10/06/2023    Lorretta Harp [facility]   If 7PM-7AM, please contact night-coverage [email or web address] 10/06/2023, 11:17 PM

## 2023-10-06 NOTE — ED Notes (Signed)
Nephew called and said to call pt's sister when ready for discharge.

## 2023-10-06 NOTE — ED Notes (Signed)
Pt in MRI.

## 2023-10-06 NOTE — ED Provider Notes (Signed)
[facility] Provider Note    Event Date/Time   First MD Initiated Contact with Patient 10/06/23 1638     (approximate)   History   Loss of Consciousness   HPI  ZDENKA LYDIC is a 82 y.o. female with a history of hypertension, hyperlipidemia, hypothyroidism, gout, and arthritis who presents with near syncope while waiting in line at a store.  The patient states that she had been started to feel weak and lightheaded for about 5 to 10 minutes.  She felt nauseous but did not get sweaty.  Subsequently she felt even more severely weak and then collapsed.  The patient states that she did not completely lose consciousness and remembers everything.  She states that now she just feels tired but denies any other acute symptoms.  She states that this has happened to her once before, several years ago.  She states that she had not eaten anything today and was rushing around to do multiple errands which she feels is what caused this to happen.  The patient denies any associated chest pain, fever, vomiting, incontinence, or shortness of breath.  I reviewed the past medical records.  The patient had several prior ED visits in September and August for unrelated symptoms.  Her most recent outpatient encounter was with orthopedics on 9/30 for knee pain.  She has no recent hospitalizations.   Physical Exam   Triage Vital Signs: ED Triage Vitals  Encounter Vitals Group     BP --      Systolic BP Percentile --      Diastolic BP Percentile --      Pulse --      Resp --      Temp --      Temp src --      SpO2 10/06/23 1643 100 %     Weight 10/06/23 1644 160 lb (72.6 kg)     Height 10/06/23 1644 5\' 2"  (1.575 m)     Head Circumference --      Peak Flow --      Pain Score 10/06/23 1643 0     Pain Loc --      Pain Education --      Exclude from Growth Chart --     Most recent vital signs: Vitals:   10/06/23 2000 10/06/23 2030  BP: 102/64 103/61  Pulse: (!) 59 (!) 56   Resp: 15 13  Temp:    SpO2: 97% 98%     General: Alert and oriented, no distress.  CV:  Good peripheral perfusion.  Normal heart sounds. Resp:  Normal effort.  Lungs CTAB. Abd:  No distention.  Other:  EOMI.  PERRLA.  No photophobia.  No facial droop.  Normal speech.  Motor intact in all extremities.  Slightly dry mucous membranes.   ED Results / Procedures / Treatments   Labs (all labs ordered are listed, but only abnormal results are displayed) Labs Reviewed  BASIC METABOLIC PANEL - Abnormal; Notable for the following components:      Result Value   Sodium 132 (*)    Potassium 2.9 (*)    Chloride 95 (*)    Glucose, Bld 107 (*)    Creatinine, Ser 1.18 (*)    GFR, Estimated 47 (*)    All other components within normal limits  CBC - Abnormal; Notable for the following components:   WBC 11.0 (*)    All other components within normal limits  URINALYSIS, ROUTINE W REFLEX  MICROSCOPIC  TROPONIN I (HIGH SENSITIVITY)  TROPONIN I (HIGH SENSITIVITY)     EKG  ED ECG REPORT I, Dionne Bucy, the attending physician, personally viewed and interpreted this ECG.  Date: 10/06/2023 EKG Time: 1649 Rate: 79 Rhythm: normal sinus rhythm QRS Axis: normal Intervals: normal ST/T Wave abnormalities: normal Narrative Interpretation: no evidence of acute ischemia    RADIOLOGY    PROCEDURES:  Critical Care performed: No  Procedures   MEDICATIONS ORDERED IN ED: Medications  sodium chloride 0.9 % bolus 1,000 mL (has no administration in time range)  sodium chloride 0.9 % bolus 1,000 mL (1,000 mLs Intravenous New Bag/Given 10/06/23 1807)     IMPRESSION / MDM / ASSESSMENT AND PLAN / ED COURSE  I reviewed the triage vital signs and the nursing notes.  82 year old female with PMH as noted above presents with an episode of near syncope after having not eaten all day; the patient did collapse, but did not hit her head states that she did not fully lose consciousness.  EMS  reported that they did not have a palpable radial pulse although there is history to suggest that the patient had a cardiac arrest.  The patient states she feels tired but is otherwise back to her baseline.  In the ED, her vital signs are normal.  Neurologic exam is nonfocal.  Physical exam is otherwise unremarkable.  Differential diagnosis includes, but is not limited to, vasovagal near syncope, dehydration/hypovolemia, metabolic cause, less likely dysrhythmia or other cardiac etiology.  We will give a fluid bolus, obtain lab workup, and reassess.  Patient's presentation is most consistent with acute presentation with potential threat to life or bodily function.  The patient is on the cardiac monitor to evaluate for evidence of arrhythmia and/or significant heart rate changes.  ----------------------------------------- 9:48 PM on 10/06/2023 -----------------------------------------  The patient's vital signs remain stable.  However after liter of fluid she is still orthostatic.  When she stands up her blood pressure goes down to the 70s systolic.  I have ordered additional fluids.  Lab workup is reassuring.  Troponins are negative.  BMP shows mild hypokalemia but otherwise no acute findings.  There is no significant leukocytosis.  Urinalysis is still pending.  A repeat EKG was done at 6:54 PM showing atrial fibrillation with RVR and a rate in the 140s.  However, the patient's heart rate has been normal on the monitor throughout her ED stay.  I was not informed that this EKG was being done and I was not given the EKG for review or informed that the patient had a possible episode of atrial fibrillation.  The patient remains asymptomatic at this time.  I consulted Dr. Clyde Lundborg from the hospitalist service; based on our discussion he agrees to evaluate the patient for admission.   FINAL CLINICAL IMPRESSION(S) / ED DIAGNOSES   Final diagnoses:  Near syncope  Orthostatic hypotension     Rx / DC  Orders   ED Discharge Orders     None        Note:  This document was prepared using Dragon voice recognition software and may include unintentional dictation errors.    Dionne Bucy, MD 10/06/23 2150

## 2023-10-06 NOTE — ED Notes (Signed)
Nephew Jearld Lesch, [phone] called to relay he is on his way from [place] to either pick up the patient if she is discharged or be with her if admitted.  EDP made decision to admit patient and Mr. Anne Hahn was called and notified of her pending admission.  Will call him back with any updates about room assignments.

## 2023-10-07 ENCOUNTER — Observation Stay (HOSPITAL_BASED_OUTPATIENT_CLINIC_OR_DEPARTMENT_OTHER)
Admit: 2023-10-07 | Discharge: 2023-10-07 | Disposition: A | Discharge status: Home / Self Care | Payer: 59 | Attending: Cardiovascular Disease | Admitting: Cardiovascular Disease

## 2023-10-07 DIAGNOSIS — F1010 Alcohol abuse, uncomplicated: Secondary | ICD-10-CM | POA: Diagnosis not present

## 2023-10-07 DIAGNOSIS — N1831 Chronic kidney disease, stage 3a: Secondary | ICD-10-CM | POA: Diagnosis not present

## 2023-10-07 DIAGNOSIS — I951 Orthostatic hypotension: Secondary | ICD-10-CM

## 2023-10-07 DIAGNOSIS — R634 Abnormal weight loss: Secondary | ICD-10-CM | POA: Diagnosis not present

## 2023-10-07 DIAGNOSIS — I4891 Unspecified atrial fibrillation: Secondary | ICD-10-CM

## 2023-10-07 DIAGNOSIS — E876 Hypokalemia: Secondary | ICD-10-CM | POA: Diagnosis not present

## 2023-10-07 DIAGNOSIS — R001 Bradycardia, unspecified: Secondary | ICD-10-CM

## 2023-10-07 DIAGNOSIS — R55 Syncope and collapse: Secondary | ICD-10-CM | POA: Diagnosis not present

## 2023-10-07 DIAGNOSIS — F03B0 Unspecified dementia, moderate, without behavioral disturbance, psychotic disturbance, mood disturbance, and anxiety: Secondary | ICD-10-CM

## 2023-10-07 DIAGNOSIS — E039 Hypothyroidism, unspecified: Secondary | ICD-10-CM | POA: Diagnosis not present

## 2023-10-07 LAB — CBC
HCT: 30.6 % — ABNORMAL LOW (ref 36.0–46.0)
Hemoglobin: 10.1 g/dL — ABNORMAL LOW (ref 12.0–15.0)
MCH: 30.1 pg (ref 26.0–34.0)
MCHC: 33 g/dL (ref 30.0–36.0)
MCV: 91.3 fL (ref 80.0–100.0)
Platelets: 222 10*3/uL (ref 150–400)
RBC: 3.35 MIL/uL — ABNORMAL LOW (ref 3.87–5.11)
RDW: 14.1 % (ref 11.5–15.5)
WBC: 9.2 10*3/uL (ref 4.0–10.5)
nRBC: 0 % (ref 0.0–0.2)

## 2023-10-07 LAB — BASIC METABOLIC PANEL
Anion gap: 11 (ref 5–15)
BUN: 16 mg/dL (ref 8–23)
CO2: 22 mmol/L (ref 22–32)
Calcium: 8.5 mg/dL — ABNORMAL LOW (ref 8.9–10.3)
Chloride: 101 mmol/L (ref 98–111)
Creatinine, Ser: 0.82 mg/dL (ref 0.44–1.00)
GFR, Estimated: >60 mL/min (ref >60)
Glucose, Bld: 83 mg/dL (ref 70–99)
Potassium: 3.6 mmol/L (ref 3.5–5.1)
Sodium: 134 mmol/L — ABNORMAL LOW (ref 135–145)

## 2023-10-07 LAB — PROTIME-INR
INR: 1.4 — ABNORMAL HIGH (ref 0.8–1.2)
Prothrombin Time: 16.9 s — ABNORMAL HIGH (ref 11.4–15.2)

## 2023-10-07 LAB — ECHOCARDIOGRAM COMPLETE
AR max vel: 1.91 cm2
AV Peak grad: 7.4 mm[Hg]
Ao pk vel: 1.36 m/s
Area-P 1/2: 3.37 cm2
Height: 62 in
S' Lateral: 2.6 cm
Weight: 2560 [oz_av]

## 2023-10-07 LAB — HEPARIN LEVEL (UNFRACTIONATED): Heparin Unfractionated: 0.31 [IU]/mL (ref 0.30–0.70)

## 2023-10-07 LAB — APTT
aPTT: >200 s (ref 24–36)
aPTT: 97 s — ABNORMAL HIGH (ref 24–36)

## 2023-10-07 LAB — ETHANOL: Alcohol, Ethyl (B): <10 mg/dL (ref <10)

## 2023-10-07 MED ORDER — HEPARIN SODIUM (PORCINE) 5000 UNIT/ML IJ SOLN
5000.0000 [IU] | Freq: Three times a day (TID) | INTRAMUSCULAR | Status: DC
  Filled 2023-10-07: qty 1

## 2023-10-07 MED ORDER — MELATONIN 5 MG PO TABS
5.0000 mg | ORAL_TABLET | Freq: Once | ORAL | Status: DC
  Filled 2023-10-07: qty 1

## 2023-10-07 MED ORDER — ENSURE ENLIVE PO LIQD
237.0000 mL | Freq: Two times a day (BID) | ORAL | Status: DC
  Administered 2023-10-08 – 2023-10-09 (×4): 237 mL via ORAL

## 2023-10-07 MED ORDER — HEPARIN BOLUS VIA INFUSION
3900.0000 [IU] | Freq: Once | INTRAVENOUS | Status: AC
  Administered 2023-10-07: 3900 [IU] via INTRAVENOUS
  Filled 2023-10-07: qty 3900

## 2023-10-07 MED ORDER — HEPARIN (PORCINE) 25000 UT/250ML-% IV SOLN
950.0000 [IU]/h | INTRAVENOUS | Status: DC
  Administered 2023-10-07: 950 [IU]/h via INTRAVENOUS
  Filled 2023-10-07: qty 250

## 2023-10-07 NOTE — Progress Notes (Signed)
ANTICOAGULATION CONSULT NOTE  Pharmacy Consult for heparin infusion Indication: atrial fibrillation  Allergies  Allergen Reactions   Celecoxib Other (See Comments)   Penicillins Itching and Rash    Patient Measurements: Height: 5\' 2"  (157.5 cm) Weight: 72.6 kg (160 lb) IBW/kg (Calculated) : 50.1 Heparin Dosing Weight: 65.6 kg  Vital Signs: Temp: 98 F (36.7 C) (11/15 2215) Temp Source: Oral (11/15 2215) BP: 92/64 (11/15 2230) Pulse Rate: 53 (11/16 0030)  Labs: Recent Labs    10/06/23 1754 10/06/23 2010  HGB 12.2  --   HCT 37.2  --   PLT 240  --   CREATININE 1.18*  --   TROPONINIHS 6 7    Estimated Creatinine Clearance: 35.5 mL/min (A) (by C-G formula based on SCr of 1.18 mg/dL (H)).   Medical History: Past Medical History:  Diagnosis Date   Arthritis    GERD (gastroesophageal reflux disease)    Gout    Hyperlipidemia    Hypertension    Hypothyroidism    Rotator cuff tear, left    Wears dentures    partial upper    Assessment: Pt is a 82 yo female presenting to ED after near syncopic episode, where EMS found pt in a fib w/ RVR and HR of 143 upon arrival to ED.  Goal of Therapy:  Heparin level 0.3-0.7 units/ml Monitor platelets by anticoagulation protocol: Yes   Plan:  Bolus 3900 units x 1 Start heparin infusion at 950 units/hr Will check HL in 8 hr after start of infusion CBC daily while on heparin  Otelia Sergeant, PharmD, [facility] 10/07/2023 2:04 AM

## 2023-10-07 NOTE — Progress Notes (Signed)
Progress Note   Patient: Shirley Knight UUV:[id number] DOB: 12-Jun-1941 DOA: 10/06/2023     0 DOS: the patient was seen and examined on 10/07/2023   Brief hospital course: Shirley Knight is a 82 y.o. female with medical history significant of HTN, HLD, CKD-3a, gout, hypothyroidism, who presents with near syncope. She suddenly collapsed when she was waiting in line at a store. Before the event, she was feeling weak and lightheaded for about 5 to 10 minutes.  Report of A-fib with RVR in the emergency department.  Started on heparin drip last night after MRI brain negative for stroke.  Patient is admitted for further management evaluation of near syncope.  Assessment and Plan: Near syncope: Possibly due to poor oral intake, dehydration. She got IV hydration in the ED. Check orthostatic vitals. MRI brain unremarkable. Fall, aspiration precautions. Check urine drug screen, alcohol level.  Atrial fibrillation ruled out: Discussed with cardiologist who does not think the EKG showed any A-fib last night he is not of hers.  Telemetry unremarkable. Will stop IV heparin drip. Check echocardiogram. Will follow cardiology recommendations.  Hypothyroidism: Continue home dose Synthroid.  Hypertension: Blood pressure stable. Continue to hold hydrochlorothiazide, amlodipine.  Chronic kidney disease stage IIIa: Kidney function stable. Continue to avoid nephrotoxic drugs.  Hypokalemia: Improved with supplementation.  Poor oral intake Dysphagia- SLP evaluation. Soft diet for now. Dietician evaluation.     PT evaluation. Out of bed to chair. Incentive spirometry. Nursing supportive care. Fall, aspiration precautions. DVT prophylaxis   Code Status: Full Code  Subjective: Patient is seen and examined today morning.  She is lying comfortably.  States that she has not been eating well, lost a lot of weight. Reports cough, throat discomfort. I called her sister over phone, who states  that she has dementia, drinks alcohol, poor oral intake, trouble swallowing, did have prior episodes of fall.  Physical Exam: Vitals:   10/07/23 0740 10/07/23 0745 10/07/23 0800 10/07/23 0900  BP: (!) 134/91 (!) 134/91 110/68 120/61  Pulse: (!) 51 60 64 69  Resp: 14 (!) 8 12   Temp: 98.5 F (36.9 C)     TempSrc: Oral     SpO2: 100% 99% 95% 100%  Weight:      Height:        General - Elderly African American female, no apparent distress HEENT - PERRLA, EOMI, atraumatic head, non tender sinuses. Lung - Clear, no rales, rhonchi, wheezes. Heart - S1, S2 heard, no murmurs, rubs, trace pedal edema. Abdomen - Soft, non tender, bowel sounds good Neuro - Alert, awake and pleasantly confused, non focal exam. Skin - Warm and dry.  Data Reviewed:      Latest Ref Rng & Units 10/07/2023    6:08 AM 10/06/2023    5:54 PM 04/29/2022    4:20 AM  CBC  WBC 4.0 - 10.5 K/uL 9.2  11.0  9.9   Hemoglobin 12.0 - 15.0 g/dL 47.4  25.9  56.3   Hematocrit 36.0 - 46.0 % 30.6  37.2  40.6   Platelets 150 - 400 K/uL 222  240  252       Latest Ref Rng & Units 10/07/2023    6:08 AM 10/06/2023    5:54 PM 04/29/2022    4:20 AM  BMP  Glucose 70 - 99 mg/dL 83  875  643   BUN 8 - 23 mg/dL 16  18  25    Creatinine 0.44 - 1.00 mg/dL 3.29  5.18  8.41  Sodium 135 - 145 mmol/L 134  132  140   Potassium 3.5 - 5.1 mmol/L 3.6  2.9  3.5   Chloride 98 - 111 mmol/L 101  95  104   CO2 22 - 32 mmol/L 22  23  30    Calcium 8.9 - 10.3 mg/dL 8.5  9.5  8.9    MR BRAIN WO CONTRAST  Result Date: 10/07/2023 CLINICAL DATA:  Initial evaluation for syncope/presyncope, stroke suspected. EXAM: MRI HEAD WITHOUT CONTRAST TECHNIQUE: Multiplanar, multiecho pulse sequences of the brain and surrounding structures were obtained without intravenous contrast. COMPARISON:  Prior CT from 04/29/2022. FINDINGS: Brain: Mild prominence of the CSF containing spaces, compatible with age-related cerebral atrophy. Patchy T2/FLAIR hyperintensity  involving the periventricular and deep white matter, consistent with chronic small vessel ischemic disease, moderately advanced in nature. Few scatter remote lacunar infarcts present about the bilateral basal ganglia, thalami, and pons. Tiny remote right cerebellar infarct noted. No evidence for acute or subacute ischemia. Gray-white matter differentiation maintained. No acute intracranial hemorrhage. Minimal chronic hemosiderin staining noted at the left temporoccipital junction. No mass lesion, midline shift or mass effect. No hydrocephalus. Cavum et septum pellucidum noted. No extra-axial fluid collection. w pituitary gland mildly prominent with convex border superiorly, but no visible discrete lesion. Vascular: Major intracranial vascular flow voids are maintained. Skull and upper cervical spine: Craniocervical junction within normal limits. Bone marrow signal intensity normal. No scalp soft tissue abnormality. Sinuses/Orbits: Prior bilateral ocular lens replacement. Paranasal sinuses are clear. Trace bilateral mastoid effusions noted, of doubtful significance. Other: None. IMPRESSION: 1. No acute intracranial abnormality. 2. Age-related cerebral atrophy with moderate chronic microvascular ischemic disease, with a few scattered remote lacunar infarcts involving the bilateral basal ganglia, thalami, and pons. Electronically Signed   By: Rise Mu M.D.   On: 10/07/2023 01:21     Family Communication: Discussed with patient, her sister over phone. They understand and agree. All questions answereed. Sister states that she does not have keys to patient's apartment asks if patient can stay over weekend. TOC evaluation for safe discharge planning.  Disposition: Status is: Observation The patient remains OBS appropriate and will d/c before 2 midnights.  Planned Discharge Destination: Home with Home Health     Time spent: 38 minutes  Author: Marcelino Duster, MD 10/07/2023 11:48  AM Secure chat 7am to 7pm For on call review [email or web address].

## 2023-10-07 NOTE — Progress Notes (Signed)
MRI negative for acute hemorrhage IMPRESSION: 1. No acute intracranial abnormality. 2. Age-related cerebral atrophy with moderate chronic microvascular ischemic disease, with a few scattered remote lacunar infarcts involving the bilateral basal ganglia, thalami, and pons.    pharmacy consult for heparin dosing for afib ordered  Donnie Mesa NP [facility] Cross Cover 7pm-7am - check amion for availability Pager [phone]

## 2023-10-07 NOTE — Consult Note (Signed)
Cardiology Consult    Patient ID: Shirley Knight MRN: [id number], DOB/AGE: Jun 22, 1941   Admit date: 10/06/2023 Date of Consult: 10/07/2023  Primary Physician: Emogene Morgan, MD Primary Cardiologist: None Requesting Provider: Dr. Clide Dales For consult: Near syncope  Patient Profile    Shirley Knight is a 82 y.o. female with a history of dementia, alcohol abuse, malnutrition, presenting after episode of near syncope  Past Medical History  Subjective  Past Medical History:  Diagnosis Date   Arthritis    GERD (gastroesophageal reflux disease)    Gout    Hyperlipidemia    Hypertension    Hypothyroidism    Rotator cuff tear, left    Wears dentures    partial upper    Past Surgical History:  Procedure Laterality Date   CATARACT EXTRACTION W/PHACO Left 09/12/2017   Procedure: CATARACT EXTRACTION PHACO AND INTRAOCULAR LENS PLACEMENT (IOC)  LEFT;  Surgeon: Nevada Crane, MD;  Location[facility];  Service: Ophthalmology;  Laterality: Left;   CATARACT EXTRACTION W/PHACO Right 10/03/2017   Procedure: CATARACT EXTRACTION PHACO AND INTRAOCULAR LENS PLACEMENT (IOC)  RIGHT;  Surgeon: Nevada Crane, MD;  Location: [facility];  Service: Ophthalmology;  Laterality: Right;   CHOLECYSTECTOMY     KNEE SURGERY Left 05/09/2014   [facility] - Dr. Elfredia Nevins     Allergies  Allergies  Allergen Reactions   Celecoxib Other (See Comments)   Penicillins Itching and Rash      History of Present Illness     Patient is a poor historian, most of the details obtained from the notes Notes indicating she was in line at the register at [employer] checking out when customers noticed she was slumping over Customers helped her to the floor, she did not have any trauma EMS called, on arrival they were not able to get a pulse, once they got her into the truck she was nauseated, lethargic, sweaty Became increasingly alert in the emergency room In the ER patient  reported she did not lose consciousness and remembers everything She reports not eating anything yesterday apart from couple coffee possibly in the morning though details unclear Normally when she goes out with her sister to do errands and shopping she will get lunch, but she did not do so yesterday  Several prior emergency room visit September, August for unrelated issues  In the ER with low blood pressure, bradycardic At home on metoprolol HCTZ, amlodipine Of note there was an EKG on arrival showing normal sinus rhythm  Around 7 PM EKG with her name but different room documenting atrial fibrillation, rate 140 bpm, on further investigation we found this was likely erroneous and from a different patient  None of her vitals show tachycardia On rounds reported feeling well with no complaints   Inpatient Medications  Subjective    allopurinol  100 mg Oral Daily   aspirin EC  81 mg Oral Daily   atorvastatin  10 mg Oral Daily   levothyroxine  75 mcg Oral Q0600   nicotine  21 mg Transdermal Daily   pantoprazole  40 mg Oral Daily    Family History    Family History  Problem Relation Age of Onset   Breast cancer Mother 64   Breast cancer Sister    She indicated that the status of her mother is unknown. She indicated that the status of her sister is unknown.   Social History    Social History   Socioeconomic History  Marital status: Single    Spouse name: Not on file   Number of children: Not on file   Years of education: Not on file   Highest education level: Not on file  Occupational History   Not on file  Tobacco Use   Smoking status: Some Days    Types: Cigarettes   Smokeless tobacco: Never  Vaping Use   Vaping status: Never Used  Substance and Sexual Activity   Alcohol use: Yes    Comment: occ   Drug use: Never   Sexual activity: Not on file  Other Topics Concern   Not on file  Social History Narrative   Not on file   Social Determinants of Health    Financial Resource Strain: Not on file  Food Insecurity: Not on file  Transportation Needs: Not on file  Physical Activity: Not on file  Stress: Not on file  Social Connections: Not on file  Intimate Partner Violence: Not on file     Review of Systems    General:  No chills, fever, night sweats or weight changes.  Cardiovascular:  No chest pain, dyspnea on exertion, edema, orthopnea, palpitations, paroxysmal nocturnal dyspnea. Dermatological: No rash, lesions/masses Respiratory: No cough, dyspnea Urologic: No hematuria, dysuria Abdominal:   No nausea, vomiting, diarrhea, bright red blood per rectum, melena, or hematemesis Neurologic:  No visual changes, wkns, changes in mental status. All other systems reviewed and are otherwise negative except as noted above.    Objective  Physical Exam    Blood pressure 110/68, pulse 64, temperature 98.5 F (36.9 C), temperature source Oral, resp. rate 12, height 5\' 2"  (1.575 m), weight 72.6 kg, SpO2 95%.  General: Pleasant, NAD Psych: Normal affect. Neuro: Alert and oriented X 3. Moves all extremities spontaneously. HEENT: Normal  Neck: Supple without bruits or JVD. Lungs:  Resp regular and unlabored, CTA. Heart: RRR no s3, s4, or murmurs. Abdomen: Soft, non-tender, non-distended, BS + x 4.  Extremities: No clubbing, cyanosis or edema. DP/PT2+, Radials 2+ and equal bilaterally.  Labs    Cardiac Enzymes Recent Labs  Lab 10/06/23 1754 10/06/23 2010  TROPONINIHS 6 7     BNP No results found for: "BNP"  ProBNP No results found for: "PROBNP"  Lab Results  Component Value Date   WBC 9.2 10/07/2023   HGB 10.1 (L) 10/07/2023   HCT 30.6 (L) 10/07/2023   MCV 91.3 10/07/2023   PLT 222 10/07/2023    Recent Labs  Lab 10/07/23 0608  NA 134*  K 3.6  CL 101  CO2 22  BUN 16  CREATININE 0.82  CALCIUM 8.5*  GLUCOSE 83   No results found for: "CHOL", "HDL", "LDLCALC", "TRIG" No results found for: "DDIMER"    Radiology  Studies    MR BRAIN WO CONTRAST  Result Date: 10/07/2023 CLINICAL DATA:  Initial evaluation for syncope/presyncope, stroke suspected. EXAM: MRI HEAD WITHOUT CONTRAST TECHNIQUE: Multiplanar, multiecho pulse sequences of the brain and surrounding structures were obtained without intravenous contrast. COMPARISON:  Prior CT from 04/29/2022. FINDINGS: Brain: Mild prominence of the CSF containing spaces, compatible with age-related cerebral atrophy. Patchy T2/FLAIR hyperintensity involving the periventricular and deep white matter, consistent with chronic small vessel ischemic disease, moderately advanced in nature. Few scatter remote lacunar infarcts present about the bilateral basal ganglia, thalami, and pons. Tiny remote right cerebellar infarct noted. No evidence for acute or subacute ischemia. Gray-white matter differentiation maintained. No acute intracranial hemorrhage. Minimal chronic hemosiderin staining noted at the left temporoccipital junction. No  mass lesion, midline shift or mass effect. No hydrocephalus. Cavum et septum pellucidum noted. No extra-axial fluid collection. w pituitary gland mildly prominent with convex border superiorly, but no visible discrete lesion. Vascular: Major intracranial vascular flow voids are maintained. Skull and upper cervical spine: Craniocervical junction within normal limits. Bone marrow signal intensity normal. No scalp soft tissue abnormality. Sinuses/Orbits: Prior bilateral ocular lens replacement. Paranasal sinuses are clear. Trace bilateral mastoid effusions noted, of doubtful significance. Other: None. IMPRESSION: 1. No acute intracranial abnormality. 2. Age-related cerebral atrophy with moderate chronic microvascular ischemic disease, with a few scattered remote lacunar infarcts involving the bilateral basal ganglia, thalami, and pons. Electronically Signed   By: Rise Mu M.D.   On: 10/07/2023 01:21      ECG & Cardiac Imaging     EKG personally  reviewed by myself on todays visit Normal sinus rhythm rate 79 bpm no significant ST-T wave changes  Assessment & Plan    Ms. Sashia Tate is a 81 year old woman with malnutrition, dementia, essential hypertension, presenting with near syncope in the setting of not eating all day, hypotensive on arrival with bradycardia  1) near syncope Presenting with hypotension, bradycardia, hypokalemia Did not eat all day, family confirming protein calorie malnutrition, worsening dementia Given weight loss likely overmedicated Would continue to hold amlodipine, metoprolol, hydrochlorothiazide HCTZ likely contributing to hypokalemia -Echocardiogram with low normal ejection fraction 50 to 55% -No plan for ischemic workup at this time Troponin negative -No indication of arrhythmia (repeat EKG with atrial fibrillation and patient's name likely erroneous and obtained from wrong ER room with wrong demographics placed) -Consider Zio monitor at discharge -Social Investment banker, operational to discuss home situation with family -Patient reports that she lives alone, is not eating, drinks alcohol per family   Signed, Signed, Dossie Arbour, MD, Ph.D [facility]  For questions or updates, please contact   Please consult [email or web address] for contact info under Cardiology/STEMI.

## 2023-10-07 NOTE — Hospital Course (Addendum)
Afebrile w/ BPs in the 90's to low 100's.  Initial ECG w/ RSR @ 79, w/o acute ST/T changes.  Labs notable for mild leukocytosis @ 11.  Pt hypokalemic @ 2.9. Na/Cl also low @ 132/95.  BUN/creat relatively stable @ 18/1.18. HsTrops nl @ 6  7.  MRI brain w/ age-related cerebral atrophy, mod chronic microvascular ischemic dzs, and few scattered remote lacunar infarcts - bilat basal ganglia, thalami, and pons.  Pt was initially planned for discharge, however ED notes indicate that despite apparently remaining in sinus rhythm on tele, a 12 lead ecg performed at 18:54 showed Afib @ 143

## 2023-10-07 NOTE — Plan of Care (Signed)

## 2023-10-07 NOTE — Progress Notes (Signed)
*  PRELIMINARY RESULTS* Echocardiogram 2D Echocardiogram has been performed.  Lenor Coffin 10/07/2023, 4:10 PM

## 2023-10-08 DIAGNOSIS — R410 Disorientation, unspecified: Secondary | ICD-10-CM

## 2023-10-08 DIAGNOSIS — R634 Abnormal weight loss: Secondary | ICD-10-CM | POA: Diagnosis not present

## 2023-10-08 DIAGNOSIS — N1831 Chronic kidney disease, stage 3a: Secondary | ICD-10-CM | POA: Diagnosis not present

## 2023-10-08 DIAGNOSIS — E876 Hypokalemia: Secondary | ICD-10-CM | POA: Diagnosis not present

## 2023-10-08 DIAGNOSIS — E871 Hypo-osmolality and hyponatremia: Secondary | ICD-10-CM

## 2023-10-08 DIAGNOSIS — G309 Alzheimer's disease, unspecified: Secondary | ICD-10-CM | POA: Diagnosis not present

## 2023-10-08 DIAGNOSIS — R55 Syncope and collapse: Secondary | ICD-10-CM | POA: Diagnosis not present

## 2023-10-08 DIAGNOSIS — I10 Essential (primary) hypertension: Secondary | ICD-10-CM | POA: Diagnosis not present

## 2023-10-08 DIAGNOSIS — E039 Hypothyroidism, unspecified: Secondary | ICD-10-CM | POA: Diagnosis not present

## 2023-10-08 LAB — CBC
HCT: 31.8 % — ABNORMAL LOW (ref 36.0–46.0)
Hemoglobin: 10.7 g/dL — ABNORMAL LOW (ref 12.0–15.0)
MCH: 29.6 pg (ref 26.0–34.0)
MCHC: 33.6 g/dL (ref 30.0–36.0)
MCV: 88.1 fL (ref 80.0–100.0)
Platelets: 237 10*3/uL (ref 150–400)
RBC: 3.61 MIL/uL — ABNORMAL LOW (ref 3.87–5.11)
RDW: 14.1 % (ref 11.5–15.5)
WBC: 8.6 10*3/uL (ref 4.0–10.5)
nRBC: 0 % (ref 0.0–0.2)

## 2023-10-08 LAB — IRON AND TIBC
Iron: 45 ug/dL (ref 28–170)
Saturation Ratios: 20 % (ref 10.4–31.8)
TIBC: 231 ug/dL — ABNORMAL LOW (ref 250–450)
UIBC: 186 ug/dL

## 2023-10-08 LAB — COMPREHENSIVE METABOLIC PANEL
ALT: 13 U/L (ref 0–44)
AST: 22 U/L (ref 15–41)
Albumin: 3.3 g/dL — ABNORMAL LOW (ref 3.5–5.0)
Alkaline Phosphatase: 107 U/L (ref 38–126)
Anion gap: 11 (ref 5–15)
BUN: 15 mg/dL (ref 8–23)
CO2: 19 mmol/L — ABNORMAL LOW (ref 22–32)
Calcium: 9.2 mg/dL (ref 8.9–10.3)
Chloride: 103 mmol/L (ref 98–111)
Creatinine, Ser: 0.89 mg/dL (ref 0.44–1.00)
GFR, Estimated: >60 mL/min (ref >60)
Glucose, Bld: 87 mg/dL (ref 70–99)
Potassium: 4 mmol/L (ref 3.5–5.1)
Sodium: 133 mmol/L — ABNORMAL LOW (ref 135–145)
Total Bilirubin: 0.2 mg/dL (ref <1.2)
Total Protein: 6.7 g/dL (ref 6.5–8.1)

## 2023-10-08 LAB — FERRITIN: Ferritin: 489 ng/mL — ABNORMAL HIGH (ref 11–307)

## 2023-10-08 LAB — VITAMIN B12: Vitamin B-12: 664 pg/mL (ref 180–914)

## 2023-10-08 LAB — FOLATE: Folate: 6.3 ng/mL (ref >5.9)

## 2023-10-08 NOTE — Plan of Care (Signed)

## 2023-10-08 NOTE — Progress Notes (Signed)
Rounding Note    Patient Name: Shirley Knight Date of Encounter: 10/08/2023  [facility] Cardiologist: [facility]  Subjective   No complaints, son at the bedside Discussed recent events Notes indicating that sister checks on her frequently Patient  lives independently  Inpatient Medications    Scheduled Meds:  allopurinol  100 mg Oral Daily   aspirin EC  81 mg Oral Daily   atorvastatin  10 mg Oral Daily   feeding supplement  237 mL Oral BID BM   levothyroxine  75 mcg Oral Q0600   melatonin  5 mg Oral Once   nicotine  21 mg Transdermal Daily   pantoprazole  40 mg Oral Daily   Continuous Infusions:  PRN Meds: acetaminophen, hydrALAZINE, ondansetron (ZOFRAN) IV   Vital Signs    Vitals:   10/07/23 2135 10/08/23 0537 10/08/23 0840 10/08/23 1641  BP: 102/71 (!) 141/95 (!) 150/86 128/84  Pulse: (!) 57 64 62 68  Resp: 18 16 16 16   Temp: 97.7 F (36.5 C) 98 F (36.7 C) 98.5 F (36.9 C) 98.5 F (36.9 C)  TempSrc: Oral  Oral Oral  SpO2: 99% 100% 100% 99%  Weight:      Height:       No intake or output data in the 24 hours ending 10/08/23 1820    10/06/2023    4:44 PM 08/11/2023    2:16 PM 06/22/2023    5:18 PM  Last 3 Weights  Weight (lbs) 160 lb 160 lb 152 lb  Weight (kg) 72.576 kg 72.576 kg 68.947 kg      Telemetry    Normal sinus rhythm- Personally Reviewed  ECG     - Personally Reviewed  Physical Exam   GEN: No acute distress.   Neck: No JVD Cardiac: RRR, no murmurs, rubs, or gallops.  Respiratory: Clear to auscultation bilaterally. GI: Soft, nontender, non-distended  MS: No edema; No deformity. Neuro:  Nonfocal  Psych: Normal affect   Labs    High Sensitivity Troponin:   Recent Labs  Lab 10/06/23 1754 10/06/23 2010  TROPONINIHS 6 7     Chemistry Recent Labs  Lab 10/06/23 1754 10/06/23 2010 10/07/23 0608 10/08/23 0526  NA 132*  --  134* 133*  K 2.9*  --  3.6 4.0  CL 95*  --  101 103  CO2 23  --  22 19*  GLUCOSE 107*   --  83 87  BUN 18  --  16 15  CREATININE 1.18*  --  0.82 0.89  CALCIUM 9.5  --  8.5* 9.2  MG  --  1.8  --   --   PROT  --   --   --  6.7  ALBUMIN  --   --   --  3.3*  AST  --   --   --  22  ALT  --   --   --  13  ALKPHOS  --   --   --  107  BILITOT  --   --   --  0.2  GFRNONAA 47*  --  >60 >60  ANIONGAP 14  --  11 11    Lipids No results for input(s): "CHOL", "TRIG", "HDL", "LABVLDL", "LDLCALC", "CHOLHDL" in the last 168 hours.  Hematology Recent Labs  Lab 10/06/23 1754 10/07/23 0608 10/08/23 0526  WBC 11.0* 9.2 8.6  RBC 4.04 3.35* 3.61*  HGB 12.2 10.1* 10.7*  HCT 37.2 30.6* 31.8*  MCV 92.1 91.3 88.1  MCH  30.2 30.1 29.6  MCHC 32.8 33.0 33.6  RDW 14.2 14.1 14.1  PLT 240 222 237   Thyroid  Recent Labs  Lab 10/06/23 2010  TSH 1.678    BNPNo results for input(s): "BNP", "PROBNP" in the last 168 hours.  DDimer No results for input(s): "DDIMER" in the last 168 hours.   Radiology    ECHOCARDIOGRAM COMPLETE  Result Date: 10/07/2023    ECHOCARDIOGRAM REPORT   Patient Name:   Shirley Knight Date of Exam: 10/07/2023 Medical Rec #:  [id number]       Height:       62.0 in Accession #:    [id number]      Weight:       160.0 lb Date of Birth:  23-Jun-1941       BSA:          1.739 m Patient Age:    82 years        BP:           126/97 mmHg Patient Gender: F               HR:           51 bpm. Exam Location:  [facility] Procedure: 2D Echo and Strain Analysis Indications:     Atrial Fibrillation I48.91  History:         Patient has no prior history of Echocardiogram examinations.  Sonographer:     Overton Mam RDCS, FASE Referring Phys:  [id number] Antonieta Iba Diagnosing Phys: Julien Nordmann MD IMPRESSIONS  1. Left ventricular ejection fraction, by estimation, is 50 to 55%. The left ventricle has low normal function. The left ventricle has no regional wall motion abnormalities. Left ventricular diastolic parameters are consistent with Grade I diastolic dysfunction (impaired relaxation). The  average left ventricular global longitudinal strain is -16.4 %.  2. Right ventricular systolic function is normal. The right ventricular size is normal. Tricuspid regurgitation signal is inadequate for assessing PA pressure.  3. The mitral valve is normal in structure. Mild mitral valve regurgitation. No evidence of mitral stenosis.  4. The aortic valve is tricuspid. Aortic valve regurgitation is not visualized. No aortic stenosis is present.  5. The inferior vena cava is normal in size with greater than 50% respiratory variability, suggesting right atrial pressure of 3 mmHg. FINDINGS  Left Ventricle: Left ventricular ejection fraction, by estimation, is 50 to 55%. The left ventricle has low normal function. The left ventricle has no regional wall motion abnormalities. The average left ventricular global longitudinal strain is -16.4 %. The left ventricular internal cavity size was normal in size. There is no left ventricular hypertrophy. Left ventricular diastolic parameters are consistent with Grade I diastolic dysfunction (impaired relaxation). Right Ventricle: The right ventricular size is normal. No increase in right ventricular wall thickness. Right ventricular systolic function is normal. Tricuspid regurgitation signal is inadequate for assessing PA pressure. Left Atrium: Left atrial size was normal in size. Right Atrium: Right atrial size was normal in size. Pericardium: There is no evidence of pericardial effusion. Mitral Valve: The mitral valve is normal in structure. Mild mitral valve regurgitation. No evidence of mitral valve stenosis. Tricuspid Valve: The tricuspid valve is normal in structure. Tricuspid valve regurgitation is mild . No evidence of tricuspid stenosis. Aortic Valve: The aortic valve is tricuspid. Aortic valve regurgitation is not visualized. No aortic stenosis is present. Aortic valve peak gradient measures 7.4 mmHg. Pulmonic Valve: The pulmonic valve was normal in structure. Pulmonic  valve regurgitation is not visualized. No evidence of pulmonic stenosis. Aorta: The aortic root is normal in size and structure. Venous: The inferior vena cava is normal in size with greater than 50% respiratory variability, suggesting right atrial pressure of 3 mmHg. IAS/Shunts: No atrial level shunt detected by color flow Doppler.  LEFT VENTRICLE PLAX 2D LVIDd:         3.70 cm   Diastology LVIDs:         2.60 cm   LV e' medial:    7.83 cm/s LV PW:         0.90 cm   LV E/e' medial:  8.6 LV IVS:        0.90 cm   LV e' lateral:   8.59 cm/s LVOT diam:     1.80 cm   LV E/e' lateral: 7.8 LV SV:         63 LV SV Index:   36        2D Longitudinal Strain LVOT Area:     2.54 cm  2D Strain GLS Avg:     -16.4 %  RIGHT VENTRICLE RV Basal diam:  2.60 cm RV S prime:     13.70 cm/s TAPSE (M-mode): 2.1 cm LEFT ATRIUM             Index        RIGHT ATRIUM           Index LA diam:        2.70 cm 1.55 cm/m   RA Area:     10.50 cm LA Vol (A2C):   27.6 ml 15.87 ml/m  RA Volume:   20.80 ml  11.96 ml/m LA Vol (A4C):   17.3 ml 9.95 ml/m LA Biplane Vol: 22.7 ml 13.06 ml/m  AORTIC VALVE                 PULMONIC VALVE AV Area (Vmax): 1.91 cm     PV Vmax:       0.88 m/s AV Vmax:        136.00 cm/s  PV Peak grad:  3.1 mmHg AV Peak Grad:   7.4 mmHg LVOT Vmax:      102.00 cm/s LVOT Vmean:     65.500 cm/s LVOT VTI:       0.248 m  AORTA Ao Root diam: 2.60 cm MITRAL VALVE MV Area (PHT): 3.37 cm    SHUNTS MV Decel Time: 225 msec    Systemic VTI:  0.25 m MV E velocity: 67.10 cm/s  Systemic Diam: 1.80 cm MV A velocity: 83.30 cm/s MV E/A ratio:  0.81 Julien Nordmann MD Electronically signed by Julien Nordmann MD Signature Date/Time: 10/07/2023/6:54:51 PM    Final    MR BRAIN WO CONTRAST  Result Date: 10/07/2023 CLINICAL DATA:  Initial evaluation for syncope/presyncope, stroke suspected. EXAM: MRI HEAD WITHOUT CONTRAST TECHNIQUE: Multiplanar, multiecho pulse sequences of the brain and surrounding structures were obtained without intravenous  contrast. COMPARISON:  Prior CT from 04/29/2022. FINDINGS: Brain: Mild prominence of the CSF containing spaces, compatible with age-related cerebral atrophy. Patchy T2/FLAIR hyperintensity involving the periventricular and deep white matter, consistent with chronic small vessel ischemic disease, moderately advanced in nature. Few scatter remote lacunar infarcts present about the bilateral basal ganglia, thalami, and pons. Tiny remote right cerebellar infarct noted. No evidence for acute or subacute ischemia. Gray-white matter differentiation maintained. No acute intracranial hemorrhage. Minimal chronic hemosiderin staining noted at the left temporoccipital junction. No mass lesion, midline shift or  mass effect. No hydrocephalus. Cavum et septum pellucidum noted. No extra-axial fluid collection. w pituitary gland mildly prominent with convex border superiorly, but no visible discrete lesion. Vascular: Major intracranial vascular flow voids are maintained. Skull and upper cervical spine: Craniocervical junction within normal limits. Bone marrow signal intensity normal. No scalp soft tissue abnormality. Sinuses/Orbits: Prior bilateral ocular lens replacement. Paranasal sinuses are clear. Trace bilateral mastoid effusions noted, of doubtful significance. Other: None. IMPRESSION: 1. No acute intracranial abnormality. 2. Age-related cerebral atrophy with moderate chronic microvascular ischemic disease, with a few scattered remote lacunar infarcts involving the bilateral basal ganglia, thalami, and pons. Electronically Signed   By: Rise Mu M.D.   On: 10/07/2023 01:21    Cardiac Studies   Echo   1. Left ventricular ejection fraction, by estimation, is 50 to 55%. The  left ventricle has low normal function. The left ventricle has no regional  wall motion abnormalities. Left ventricular diastolic parameters are  consistent with Grade I diastolic  dysfunction (impaired relaxation). The average left  ventricular global  longitudinal strain is -16.4 %.   2. Right ventricular systolic function is normal. The right ventricular  size is normal. Tricuspid regurgitation signal is inadequate for assessing  PA pressure.   3. The mitral valve is normal in structure. Mild mitral valve  regurgitation. No evidence of mitral stenosis.   4. The aortic valve is tricuspid. Aortic valve regurgitation is not  visualized. No aortic stenosis is present.   5. The inferior vena cava is normal in size with greater than 50%  respiratory variability, suggesting right atrial pressure of 3 mmHg.   Patient Profile     Shirley Knight is a 82 year old woman with malnutrition, dementia, essential hypertension, presenting with near syncope in the setting of not eating all day, hypotensive on arrival with bradycardia   1) near syncope Presenting with hypotension, bradycardia, hypokalemia In the setting of dementia, protein calorie malnutrition, lives alone Sister checks on her Would continue to hold outpatient blood pressure medications including amlodipine, metoprolol, hydrochlorothiazide HCTZ likely contributing to hypokalemia -Echocardiogram with low normal ejection fraction 50 to 55% -No plan for ischemic workup at this time Troponin negative Telemetry reviewed with no significant arrhythmia EKG with atrial fibrillation in the emergency room was erroneous, and obtained from wrong ER room with wrong demographics placed.  Patient was in room 9, ER was taken from patient in room 8) -Consider Zio monitor at discharge -[employer], operational to discuss home situation with family Unclear if she is safe to stay by herself, son lives in [place] and was visiting today not eating well, losing weight,, drinks alcohol per family Dementia increasingly a problem  [facility] will sign off.   Medication Recommendations: No medication changes Other recommendations (labs, testing, etc): Consider Zio  monitor at discharge Follow up as an outpatient: Outpatient follow-up in clinic    For questions or updates, please contact [facility] Please consult [email or web address] for contact info under        Signed, Julien Nordmann, MD  10/08/2023, 6:20 PM

## 2023-10-08 NOTE — Progress Notes (Addendum)
Progress Note   Patient: Shirley Knight BMW:[id number] DOB: [birth year] DOA: 10/06/2023     0 DOS: the patient was seen and examined on 10/08/2023   Brief hospital course: DELANDA REMPFER is a 82 y.o. female with medical history significant of HTN, HLD, CKD-3a, gout, hypothyroidism, who presents with near syncope. She suddenly collapsed when she was waiting in line at a store. Before the event, she was feeling weak and lightheaded for about 5 to 10 minutes.  Report of A-fib with RVR in the emergency department.  Started on heparin drip last night after MRI brain negative for stroke.  Patient is admitted for further management evaluation of near syncope.  Assessment and Plan: Near syncope: Possibly due to poor oral intake, dehydration. She got IV hydration in the ED. Check orthostatic vitals. MRI brain unremarkable. She is confused last night. Could be from underlying dementia. Fall, aspiration, delirium precautions. Urine drug screen, alcohol level pending.  Atrial fibrillation ruled out: Discussed with cardiologist who think the EKG showed any A-fib last night he is not of hers. There was a mix up. Telemetry unremarkable. Stopped IV heparin drip. Echocardiogram showed EF 55%, G1DD. Cardiology recommendations appreciated.  Hypothyroidism: Continue home dose Synthroid.  Hypertension: Blood pressure stable. Continue to hold hydrochlorothiazide, amlodipine.  Chronic kidney disease stage IIIa: Kidney function stable. Continue to avoid nephrotoxic drugs.  Hyponatremia: From hydrochlorothiazide. Trend Na.  Hypokalemia -Improved with supplementation.  Poor oral intake Dysphagia- SLP evaluation. Soft diet for now. Dietician evaluation.    PT/ OT evaluation for disposition. She lives alone. Out of bed to chair. Incentive spirometry. Nursing supportive care. DVT prophylaxis   Code Status: Full Code  Subjective: Patient is seen and examined today morning.  She is lying  comfortably.  Was confused last night requiring safety monitor. Denies any complaints, eating poor.  Physical Exam: Vitals:   10/07/23 1433 10/07/23 2135 10/08/23 0537 10/08/23 0840  BP: 136/80 102/71 (!) 141/95 (!) 150/86  Pulse: (!) 53 (!) 57 64 62  Resp: 18 18 16 16   Temp: 98.2 F (36.8 C) 97.7 F (36.5 C) 98 F (36.7 C) 98.5 F (36.9 C)  TempSrc: Oral Oral  Oral  SpO2: 90% 99% 100% 100%  Weight:      Height:        General - Elderly African American female, no apparent distress HEENT - PERRLA, EOMI, atraumatic head, non tender sinuses. Lung - Clear, no rales, rhonchi, wheezes. Heart - S1, S2 heard, no murmurs, rubs, trace pedal edema. Abdomen - Soft, non tender, bowel sounds good Neuro - Alert, awake and pleasantly confused, non focal exam. Skin - Warm and dry.  Data Reviewed:      Latest Ref Rng & Units 10/08/2023    5:26 AM 10/07/2023    6:08 AM 10/06/2023    5:54 PM  CBC  WBC 4.0 - 10.5 K/uL 8.6  9.2  11.0   Hemoglobin 12.0 - 15.0 g/dL 27.2  53.6  64.4   Hematocrit 36.0 - 46.0 % 31.8  30.6  37.2   Platelets 150 - 400 K/uL 237  222  240       Latest Ref Rng & Units 10/08/2023    5:26 AM 10/07/2023    6:08 AM 10/06/2023    5:54 PM  BMP  Glucose 70 - 99 mg/dL 87  83  034   BUN 8 - 23 mg/dL 15  16  18    Creatinine 0.44 - 1.00 mg/dL 7.42  5.95  6.38  Sodium 135 - 145 mmol/L 133  134  132   Potassium 3.5 - 5.1 mmol/L 4.0  3.6  2.9   Chloride 98 - 111 mmol/L 103  101  95   CO2 22 - 32 mmol/L 19  22  23    Calcium 8.9 - 10.3 mg/dL 9.2  8.5  9.5    ECHOCARDIOGRAM COMPLETE  Result Date: 10/07/2023    ECHOCARDIOGRAM REPORT   Patient Name:   JALYSE MEERSMAN Date of Exam: 10/07/2023 Medical Rec #:  [id number]       Height:       62.0 in Accession #:    [id number]      Weight:       160.0 lb Date of Birth:  11-Jan-1941       BSA:          1.739 m Patient Age:    82 years        BP:           126/97 mmHg Patient Gender: F               HR:           51 bpm. Exam  Location:  [facility] Procedure: 2D Echo and Strain Analysis Indications:     Atrial Fibrillation I48.91  History:         Patient has no prior history of Echocardiogram examinations.  Sonographer:     Overton Mam RDCS, FASE Referring Phys:  [id number] Antonieta Iba Diagnosing Phys: Julien Nordmann MD IMPRESSIONS  1. Left ventricular ejection fraction, by estimation, is 50 to 55%. The left ventricle has low normal function. The left ventricle has no regional wall motion abnormalities. Left ventricular diastolic parameters are consistent with Grade I diastolic dysfunction (impaired relaxation). The average left ventricular global longitudinal strain is -16.4 %.  2. Right ventricular systolic function is normal. The right ventricular size is normal. Tricuspid regurgitation signal is inadequate for assessing PA pressure.  3. The mitral valve is normal in structure. Mild mitral valve regurgitation. No evidence of mitral stenosis.  4. The aortic valve is tricuspid. Aortic valve regurgitation is not visualized. No aortic stenosis is present.  5. The inferior vena cava is normal in size with greater than 50% respiratory variability, suggesting right atrial pressure of 3 mmHg. FINDINGS  Left Ventricle: Left ventricular ejection fraction, by estimation, is 50 to 55%. The left ventricle has low normal function. The left ventricle has no regional wall motion abnormalities. The average left ventricular global longitudinal strain is -16.4 %. The left ventricular internal cavity size was normal in size. There is no left ventricular hypertrophy. Left ventricular diastolic parameters are consistent with Grade I diastolic dysfunction (impaired relaxation). Right Ventricle: The right ventricular size is normal. No increase in right ventricular wall thickness. Right ventricular systolic function is normal. Tricuspid regurgitation signal is inadequate for assessing PA pressure. Left Atrium: Left atrial size was normal in size. Right Atrium:  Right atrial size was normal in size. Pericardium: There is no evidence of pericardial effusion. Mitral Valve: The mitral valve is normal in structure. Mild mitral valve regurgitation. No evidence of mitral valve stenosis. Tricuspid Valve: The tricuspid valve is normal in structure. Tricuspid valve regurgitation is mild . No evidence of tricuspid stenosis. Aortic Valve: The aortic valve is tricuspid. Aortic valve regurgitation is not visualized. No aortic stenosis is present. Aortic valve peak gradient measures 7.4 mmHg. Pulmonic Valve: The pulmonic valve was normal in structure. Pulmonic valve regurgitation  is not visualized. No evidence of pulmonic stenosis. Aorta: The aortic root is normal in size and structure. Venous: The inferior vena cava is normal in size with greater than 50% respiratory variability, suggesting right atrial pressure of 3 mmHg. IAS/Shunts: No atrial level shunt detected by color flow Doppler.  LEFT VENTRICLE PLAX 2D LVIDd:         3.70 cm   Diastology LVIDs:         2.60 cm   LV e' medial:    7.83 cm/s LV PW:         0.90 cm   LV E/e' medial:  8.6 LV IVS:        0.90 cm   LV e' lateral:   8.59 cm/s LVOT diam:     1.80 cm   LV E/e' lateral: 7.8 LV SV:         63 LV SV Index:   36        2D Longitudinal Strain LVOT Area:     2.54 cm  2D Strain GLS Avg:     -16.4 %  RIGHT VENTRICLE RV Basal diam:  2.60 cm RV S prime:     13.70 cm/s TAPSE (M-mode): 2.1 cm LEFT ATRIUM             Index        RIGHT ATRIUM           Index LA diam:        2.70 cm 1.55 cm/m   RA Area:     10.50 cm LA Vol (A2C):   27.6 ml 15.87 ml/m  RA Volume:   20.80 ml  11.96 ml/m LA Vol (A4C):   17.3 ml 9.95 ml/m LA Biplane Vol: 22.7 ml 13.06 ml/m  AORTIC VALVE                 PULMONIC VALVE AV Area (Vmax): 1.91 cm     PV Vmax:       0.88 m/s AV Vmax:        136.00 cm/s  PV Peak grad:  3.1 mmHg AV Peak Grad:   7.4 mmHg LVOT Vmax:      102.00 cm/s LVOT Vmean:     65.500 cm/s LVOT VTI:       0.248 m  AORTA Ao Root diam:  2.60 cm MITRAL VALVE MV Area (PHT): 3.37 cm    SHUNTS MV Decel Time: 225 msec    Systemic VTI:  0.25 m MV E velocity: 67.10 cm/s  Systemic Diam: 1.80 cm MV A velocity: 83.30 cm/s MV E/A ratio:  0.81 Julien Nordmann MD Electronically signed by Julien Nordmann MD Signature Date/Time: 10/07/2023/6:54:51 PM    Final    MR BRAIN WO CONTRAST  Result Date: 10/07/2023 CLINICAL DATA:  Initial evaluation for syncope/presyncope, stroke suspected. EXAM: MRI HEAD WITHOUT CONTRAST TECHNIQUE: Multiplanar, multiecho pulse sequences of the brain and surrounding structures were obtained without intravenous contrast. COMPARISON:  Prior CT from 04/29/2022. FINDINGS: Brain: Mild prominence of the CSF containing spaces, compatible with age-related cerebral atrophy. Patchy T2/FLAIR hyperintensity involving the periventricular and deep white matter, consistent with chronic small vessel ischemic disease, moderately advanced in nature. Few scatter remote lacunar infarcts present about the bilateral basal ganglia, thalami, and pons. Tiny remote right cerebellar infarct noted. No evidence for acute or subacute ischemia. Gray-white matter differentiation maintained. No acute intracranial hemorrhage. Minimal chronic hemosiderin staining noted at the left temporoccipital junction. No mass lesion, midline shift or mass effect.  No hydrocephalus. Cavum et septum pellucidum noted. No extra-axial fluid collection. w pituitary gland mildly prominent with convex border superiorly, but no visible discrete lesion. Vascular: Major intracranial vascular flow voids are maintained. Skull and upper cervical spine: Craniocervical junction within normal limits. Bone marrow signal intensity normal. No scalp soft tissue abnormality. Sinuses/Orbits: Prior bilateral ocular lens replacement. Paranasal sinuses are clear. Trace bilateral mastoid effusions noted, of doubtful significance. Other: None. IMPRESSION: 1. No acute intracranial abnormality. 2. Age-related  cerebral atrophy with moderate chronic microvascular ischemic disease, with a few scattered remote lacunar infarcts involving the bilateral basal ganglia, thalami, and pons. Electronically Signed   By: Rise Mu M.D.   On: 10/07/2023 01:21     Family Communication: Discussed with patient, she understand and agree. All questions answereed. She is confused, lives alone, need to assess social situation. TOC evaluation for safe discharge planning.  Disposition: Status is: Observation The patient remains OBS appropriate and will d/c before 2 midnights.  Planned Discharge Destination: Home with Home Health     Time spent: 35 minutes  Author: Marcelino Duster, MD 10/08/2023 11:30 AM Secure chat 7am to 7pm For on call review [email or web address].

## 2023-10-08 NOTE — Evaluation (Signed)
Physical Therapy Evaluation Patient Details Name: OAKLI ZECH MRN: [id number] DOB: 1941/04/17 Today's Date: 10/08/2023  History of Present Illness  Patient is a 82 year old female who presents after near syncopal episode. History of HTN, HLD, CKD-3a, gout, hypothyroidism.   Clinical Impression  Patient is agreeable to PT evaluation. She was seated on edge of bed eating lunch and asking about going home today. She lives alone but reports her sister checks in with her frequently. She is independent at baseline.   Today, the patient is pleasantly confused. She is disoriented to time and situation. Blood pressure monitored throughout session with no dizziness reported with upright activity. The patient is Mod I to stand from bed. The patient ambulated in the hallway with supervision initially progressing to Mod I with increased ambulation distance without device. She reports feeling better now than before coming to the hospital. Based on her cognition, supervision is recommended after this hospital stay for safety. Consider home health PT for safety evaluation in her own home setting. PT will continue to follow while in the hospital.       If plan is discharge home, recommend the following: Supervision due to cognitive status;Direct supervision/assist for medications management;Direct supervision/assist for financial management;Assist for transportation;Help with stairs or ramp for entrance   Can travel by private vehicle        Equipment Recommendations None recommended by PT  Recommendations for Other Services       Functional Status Assessment Patient has had a recent decline in their functional status and demonstrates the ability to make significant improvements in function in a reasonable and predictable amount of time.     Precautions / Restrictions Precautions Precautions: Fall Restrictions Weight Bearing Restrictions: No      Mobility  Bed Mobility                General bed mobility comments: not assessed as patient sitting up on arrival and post session    Transfers Overall transfer level: Needs assistance Equipment used: None Transfers: Sit to/from Stand Sit to Stand: Modified independent (Device/Increase time)                Ambulation/Gait Ambulation/Gait assistance: Modified independent (Device/Increase time), Supervision Gait Distance (Feet): 175 Feet Assistive device: None Gait Pattern/deviations: Step-through pattern Gait velocity: decreased     General Gait Details: no loss of balance with hallway ambulation. supervision provided initially with ambulation, progressing to Mod I with increased ambulation distance.one sway to the left that is self corrected  Stairs            Wheelchair Mobility     Tilt Bed    Modified Rankin (Stroke Patients Only)       Balance Overall balance assessment: Needs assistance Sitting-balance support: Feet supported Sitting balance-Leahy Scale: Good     Standing balance support: No upper extremity supported Standing balance-Leahy Scale: Fair Standing balance comment: no external support required                             Pertinent Vitals/Pain Pain Assessment Pain Assessment: No/denies pain    Home Living Family/patient expects to be discharged to:: Private residence Living Arrangements: Alone Available Help at Discharge: Family;Available PRN/intermittently Type of Home: Apartment Home Access: Stairs to enter   Entrance Stairs-Number of Steps: 1 step   Home Layout: One level Home Equipment: Agricultural consultant (2 wheels) Additional Comments: sister is there with her at home in  the morning (she lives next door)    Prior Function Prior Level of Function : Independent/Modified Independent;Driving             Mobility Comments: independent ADLs Comments: independent     Extremity/Trunk Assessment   Upper Extremity Assessment Upper Extremity  Assessment: Overall WFL for tasks assessed    Lower Extremity Assessment Lower Extremity Assessment: Overall WFL for tasks assessed       Communication   Communication Communication: No apparent difficulties  Cognition Arousal: Alert Behavior During Therapy: WFL for tasks assessed/performed Overall Cognitive Status: No family/caregiver present to determine baseline cognitive functioning Area of Impairment: Memory, Following commands, Safety/judgement, Orientation                 Orientation Level: Disoriented to, Situation, Time   Memory: Decreased short-term memory Following Commands: Follows one step commands with increased time Safety/Judgement: Decreased awareness of safety              General Comments General comments (skin integrity, edema, etc.): blood pressure monitored throughout session    Exercises     Assessment/Plan    PT Assessment Patient needs continued PT services  PT Problem List Decreased cognition;Decreased mobility       PT Treatment Interventions DME instruction;Gait training;Stair training;Functional mobility training;Therapeutic activities;Therapeutic exercise;Balance training;Neuromuscular re-education;Cognitive remediation;Patient/family education    PT Goals (Current goals can be found in the Care Plan section)  Acute Rehab PT Goals Patient Stated Goal: to go home today PT Goal Formulation: With patient Time For Goal Achievement: 10/22/23 Potential to Achieve Goals: Good    Frequency Min 1X/week     Co-evaluation               AM-PAC PT "6 Clicks" Mobility  Outcome Measure Help needed turning from your back to your side while in a flat bed without using bedrails?: None Help needed moving from lying on your back to sitting on the side of a flat bed without using bedrails?: None Help needed moving to and from a bed to a chair (including a wheelchair)?: None Help needed standing up from a chair using your arms (e.g.,  wheelchair or bedside chair)?: None Help needed to walk in hospital room?: A Little Help needed climbing 3-5 steps with a railing? : A Little 6 Click Score: 22    End of Session Equipment Utilized During Treatment: Gait belt Activity Tolerance: Patient tolerated treatment well Patient left: in bed;with call bell/phone within reach;with bed alarm set (seated on edge of bed eating lunch with bed alarn on. telesitter present) Nurse Communication: Mobility status PT Visit Diagnosis: Unsteadiness on feet (R26.81);Muscle weakness (generalized) (M62.81)    Time: 1610-9604 PT Time Calculation (min) (ACUTE ONLY): 23 min   Charges:   PT Evaluation $PT Eval Low Complexity: 1 Low   PT General Charges $$ ACUTE PT VISIT: 1 Visit         Samani Bernard, PT, MPT   Ina Homes 10/08/2023, 3:06 PM

## 2023-10-08 NOTE — Evaluation (Signed)
Clinical/Bedside Swallow Evaluation Patient Details  Name: Shirley Knight MRN: [id number] Date of Birth: 1941-02-21  Today's Date: 10/08/2023 Time: SLP Start Time (ACUTE ONLY): 0902 SLP Stop Time (ACUTE ONLY): 2130 SLP Time Calculation (min) (ACUTE ONLY): 20 min  Past Medical History:  Past Medical History:  Diagnosis Date   Arthritis    GERD (gastroesophageal reflux disease)    Gout    Hyperlipidemia    Hypertension    Hypothyroidism    Rotator cuff tear, left    Wears dentures    partial upper   Past Surgical History:  Past Surgical History:  Procedure Laterality Date   CATARACT EXTRACTION W/PHACO Left 09/12/2017   Procedure: CATARACT EXTRACTION PHACO AND INTRAOCULAR LENS PLACEMENT (IOC)  LEFT;  Surgeon: Nevada Crane, MD;  Location: [facility];  Service: Ophthalmology;  Laterality: Left;   CATARACT EXTRACTION W/PHACO Right 10/03/2017   Procedure: CATARACT EXTRACTION PHACO AND INTRAOCULAR LENS PLACEMENT (IOC)  RIGHT;  Surgeon: Nevada Crane, MD;  Location: [facility];  Service: Ophthalmology;  Laterality: Right;   CHOLECYSTECTOMY     KNEE SURGERY Left 05/09/2014   [facility] - Dr. Elfredia Nevins   HPI:  Shirley Knight is a 82 y.o. female who presented to ED with near syncope. Pt with medical history significant of HTN, HLD, CKD-3a, gout, and hypothyroidism. MRI brain, "1. No acute intracranial abnormality.  2. 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 Pt presents with s/sx mild oral dysphagia c/b prolonged/inefficient mastication of solids which is likely due to dental status. Pharyngeal swallow appeared [facility] per clinical assessment. SLP to sign off as pt has no acute SLP needs.   SLP Visit Diagnosis:  Dysphagia, oral phase (R13.11)    Aspiration Risk  No limitations    Diet Recommendation Dysphagia 3 (Mech soft);Thin liquid    Liquid Administration via: Spoon;Cup;Straw Medication Administration:  (as tolerated) Supervision: Patient able to self feed Compensations: Small sips/bites Postural Changes: Seated upright at 90 degrees;Remain upright for at least 30 minutes after po intake    Other  Recommendations Oral Care Recommendations: Oral care BID    Recommendations for follow up therapy are one component of a multi-disciplinary discharge planning process, led by the attending physician.  Recommendations may be updated based on patient status, additional functional criteria and insurance authorization.  Follow up Recommendations No SLP follow up         Functional Status Assessment Patient has not had a recent decline in their functional status         Prognosis Prognosis for improved oropharyngeal function: Good      Swallow Study   General Date of Onset: 10/06/23 HPI: Shirley Knight is a 82 y.o. female who presented to ED with near syncope. Pt with medical history significant of HTN, HLD, CKD-3a, gout, and hypothyroidism. MRI brain, "1. No acute intracranial abnormality.  2. 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, thalami, and pons." Type of Study: Bedside Swallow Evaluation Previous Swallow Assessment: none Diet Prior to this Study: Regular;Thin liquids (Level 0) Temperature Spikes Noted: No Respiratory Status: Room air History of Recent Intubation: No Behavior/Cognition: Alert;Cooperative;Pleasant mood Oral Cavity Assessment: Within Functional Limits Oral Care  Completed by SLP: Yes Oral Cavity - Dentition: Dentures, not available;Missing dentition (wears upper dentures; not present for evaluation) Vision: Functional for  self-feeding Self-Feeding Abilities: Able to feed self Patient Positioning: Upright in bed Baseline Vocal Quality: Normal Volitional Cough: Strong Volitional Swallow: Able to elicit    Oral/Motor/Sensory Function Overall Oral Motor/Sensory Function: Within functional limits   Ice Chips Ice chips: Not tested   Thin Liquid Thin Liquid: Within functional limits Presentation: Straw    Nectar Thick Nectar Thick Liquid: Not tested   Honey Thick Honey Thick Liquid: Not tested   Puree Puree: Within functional limits Presentation: Self Fed   Solid     Solid: Impaired Presentation: Self Fed Oral Phase Impairments: Impaired mastication Oral Phase Functional Implications: Prolonged oral transit;Impaired mastication Pharyngeal Phase Impairments:  (WFL)     Clyde Canterbury, M.S., CCC-SLP Speech-Language Pathologist [facility] [phone] (ASCOM)  Alessandra Bevels Omunique Pederson 10/08/2023,11:08 AM

## 2023-10-09 ENCOUNTER — Ambulatory Visit: Payer: 59

## 2023-10-09 DIAGNOSIS — F1010 Alcohol abuse, uncomplicated: Secondary | ICD-10-CM

## 2023-10-09 DIAGNOSIS — G309 Alzheimer's disease, unspecified: Secondary | ICD-10-CM

## 2023-10-09 DIAGNOSIS — F02B0 Dementia in other diseases classified elsewhere, moderate, without behavioral disturbance, psychotic disturbance, mood disturbance, and anxiety: Secondary | ICD-10-CM

## 2023-10-09 DIAGNOSIS — E039 Hypothyroidism, unspecified: Secondary | ICD-10-CM | POA: Diagnosis not present

## 2023-10-09 DIAGNOSIS — R634 Abnormal weight loss: Secondary | ICD-10-CM

## 2023-10-09 DIAGNOSIS — R55 Syncope and collapse: Secondary | ICD-10-CM | POA: Diagnosis not present

## 2023-10-09 MED ORDER — ENSURE ENLIVE PO LIQD: 237.0000 mL | Freq: Two times a day (BID) | ORAL | 12 refills | Status: AC

## 2023-10-09 NOTE — Evaluation (Signed)
Occupational Therapy Evaluation Patient Details Name: Shirley Knight MRN: [id number] DOB: 1941/03/26 Today's Date: 10/09/2023   History of Present Illness Patient is a 82 year old female who presents after near syncopal episode. History of HTN, HLD, CKD-3a, gout, hypothyroidism.   Clinical Impression   Pt was seen for OT evaluation this date. Prior to hospital admission, pt lives at home alone where she reports IND with all tasks/mobility.   Pt presents to acute OT demonstrating impaired ADL performance and functional mobility 2/2 mild weakness/balance deficits mostly related to near syncope episode and limited safety awareness from dementa (See OT problem list for additional functional deficits). Pt currently requires MOD I for bed mobility, STS and mobility in the room to the door and back. She demo ability to sit in recliner and don/doff socks for LB dressing with MOD I. After x10 minutes of standing pt's BP had dropped to 110/68 with pt reporting feeling "a little wobbly." BP in supine 148/76, sitting 138/96.  Pt would benefit from skilled OT services to address noted impairments and functional limitations (see below for any additional details) in order to maximize safety and independence while minimizing falls risk and caregiver burden. Do recommend the need for follow up OT services upon acute hospital DC to ensure safety in home environment with supervision d/t limited safety awareness from dementia.        If plan is discharge home, recommend the following: A little help with walking and/or transfers;Supervision due to cognitive status;Direct supervision/assist for financial management;Assist for transportation;Assistance with cooking/housework;Help with stairs or ramp for entrance;Direct supervision/assist for medications management    Functional Status Assessment  Patient has had a recent decline in their functional status and demonstrates the ability to make significant improvements  in function in a reasonable and predictable amount of time.  Equipment Recommendations  Tub/shower seat    Recommendations for Other Services       Precautions / Restrictions Precautions Precautions: Fall Restrictions Weight Bearing Restrictions: No      Mobility Bed Mobility Overal bed mobility: Modified Independent             General bed mobility comments: MOD I with bed mobility requiring increased time only    Transfers Overall transfer level: Needs assistance Equipment used: None Transfers: Sit to/from Stand Sit to Stand: Modified independent (Device/Increase time)           General transfer comment: MOD I for STS from EOB and ambulated to the door and back in room using no AD and MOD I/SUP      Balance   Sitting-balance support: Feet supported       Standing balance support: No upper extremity supported   Standing balance comment: no AD use and no external support from therapist needed although pt does mention feeling "wobbly" by end of session with increased standing time                           ADL either performed or assessed with clinical judgement   ADL Overall ADL's : Modified independent                                       General ADL Comments: able to don/doff socks seated in recliner with MOD I/extra time; self feeding seated with IND     Vision  Perception         Praxis         Pertinent Vitals/Pain Pain Assessment Pain Assessment: No/denies pain     Extremity/Trunk Assessment Upper Extremity Assessment Upper Extremity Assessment: Overall WFL for tasks assessed   Lower Extremity Assessment Lower Extremity Assessment: Overall WFL for tasks assessed       Communication Communication Communication: No apparent difficulties   Cognition Arousal: Alert Behavior During Therapy: WFL for tasks assessed/performed Overall Cognitive Status: No family/caregiver present to determine  baseline cognitive functioning Area of Impairment: Memory, Following commands, Safety/judgement, Orientation                 Orientation Level: Disoriented to, Situation, Time   Memory: Decreased short-term memory Following Commands: Follows one step commands with increased time Safety/Judgement: Decreased awareness of safety           General Comments  BP monitored throughout with drop from sit 138/96 to stand after 5-10 minutes 110/68 with pt reporting feeling "wobbly"    Exercises Other Exercises Other Exercises: Edu on role of OT in acute setting and importance to maximize her strength/safety/IND.   Shoulder Instructions      Home Living Family/patient expects to be discharged to:: Private residence Living Arrangements: Alone Available Help at Discharge: Family;Available PRN/intermittently Type of Home: Apartment Home Access: Stairs to enter Entrance Stairs-Number of Steps: 1 step   Home Layout: One level               Home Equipment: Agricultural consultant (2 wheels)   Additional Comments: sister is there with her at home in the morning (she lives next door)      Prior Functioning/Environment Prior Level of Function : Independent/Modified Independent;Driving             Mobility Comments: independent ADLs Comments: independent        OT Problem List: Decreased safety awareness      OT Treatment/Interventions: Self-care/ADL training;Therapeutic activities;Therapeutic exercise;Energy conservation;Patient/family education;Balance training    OT Goals(Current goals can be found in the care plan section) Acute Rehab OT Goals Patient Stated Goal: return home OT Goal Formulation: With patient Time For Goal Achievement: 10/23/23 Potential to Achieve Goals: Good ADL Goals Pt Will Perform Lower Body Bathing: with modified independence;sit to/from stand;sitting/lateral leans Pt Will Perform Lower Body Dressing: with modified independence;sitting/lateral  leans;sit to/from stand Pt Will Transfer to Toilet: with modified independence;regular height toilet;ambulating Pt Will Perform Toileting - Clothing Manipulation and hygiene: with modified independence;sit to/from stand;sitting/lateral leans Additional ADL Goal #1: Pt will perform simualted cooking/cleaning tasks with MOD I to SUP to promote safety and return home.  OT Frequency: Min 1X/week    Co-evaluation              AM-PAC OT "6 Clicks" Daily Activity     Outcome Measure Help from another person eating meals?: None Help from another person taking care of personal grooming?: None Help from another person toileting, which includes using toliet, bedpan, or urinal?: None Help from another person bathing (including washing, rinsing, drying)?: A Little Help from another person to put on and taking off regular upper body clothing?: None Help from another person to put on and taking off regular lower body clothing?: A Little 6 Click Score: 22   End of Session    Activity Tolerance: Patient tolerated treatment well Patient left: in chair;with call bell/phone within reach  OT Visit Diagnosis: Other abnormalities of gait and mobility (R26.89)  Time: 0940-1006 OT Time Calculation (min): 26 min Charges:  OT General Charges $OT Visit: 1 Visit OT Evaluation $OT Eval Low Complexity: 1 Low OT Treatments $Therapeutic Activity: 8-22 mins Jericho Cieslik, OTR/L 10/09/23, 11:56 AM  Qamar Aughenbaugh E Lu Paradise 10/09/2023, 11:53 AM

## 2023-10-09 NOTE — Discharge Summary (Signed)
Physician Discharge Summary   Patient: Shirley Knight MRN: [id number] DOB: Jan 22, 1941  Admit date:     10/06/2023  Discharge date: 10/09/23  Discharge Physician: Marcelino Duster   PCP: Emogene Morgan, MD   Recommendations at discharge:    PCP follow up in 1 week.  Discharge Diagnoses: Principal Problem:   Near syncope Active Problems:   Hypothyroidism   Hyperlipidemia   HTN (hypertension)   Chronic kidney disease, stage 3a (HCC)   Hypokalemia   Gout   Leukocytosis   Orthostatic hypotension   Bradycardia   Weight loss   Alcohol abuse   Moderate dementia (HCC)  Resolved Problems:   * No resolved hospital problems. *  Hospital Course: Shirley Knight is a 82 y.o. female with medical history significant of HTN, HLD, CKD-3a, gout, hypothyroidism, who presents with near syncope. She suddenly collapsed when she was waiting in line at a store. Before the event, she was feeling weak and lightheaded for about 5 to 10 minutes.  Report of A-fib with RVR in the emergency department.  Started on heparin drip last night after MRI brain negative for stroke.  Patient is admitted for further management evaluation of near syncope.   In the ED, her EKG is reported to have A-fib, admitted to hospitalist service for further management evaluation.  Cardiologist evaluated patient recommended to stop heparin drip as there is a mixup of EKG and she does not have A-fib.  Telemetry unremarkable.  Patient's symptoms possibly due to poor oral intake, dehydration.  Electrolytes abnormal, cardiologist advised to hold antihypertensive medications.  She worked with PT who advised rolling walker, home health services which are arranged.  Patient is hemodynamically stable to be discharged home with primary care physician follow-up upon discharge.   Assessment and Plan: Near syncope: Possibly due to poor oral intake, dehydration. MRI brain unremarkable. Monitor confusion could be from underlying  dementia. Patient's appetite improved, she is eating better. Fall, aspiration, delirium precautions. PT evaluated her advised rolling walker, home health services.   Atrial fibrillation ruled out: Discussed with cardiologist who think the EKG showed any A-fib when the ED is not of hers. There was a mix up. Telemetry unremarkable. Stopped IV heparin drip. Echocardiogram showed EF 55%, G1DD. Stop hydrochlorothiazide given her low potassium, sodium.  Hypothyroidism: Continue home dose Synthroid.   Hypertension: Blood pressure improved. Started home dose amlodipine.  Stop hydrochlorothiazide given electrolyte abnormalities.   Chronic kidney disease stage IIIa: Kidney function stable. Continue to avoid nephrotoxic drugs.   Hyponatremia: From hydrochlorothiazide use.   Hypokalemia -Improved with supplementation.   Poor oral intake Dysphagia- SLP evaluated her, advised soft diet Encourage oral diet, supplements.      Consultants: Cardiology Procedures performed: None Disposition: Home health Diet recommendation:  Discharge Diet Orders (From admission, onward)     Start     Ordered   10/09/23 0000  Diet - low sodium heart healthy        10/09/23 1055           Cardiac diet DISCHARGE MEDICATION: Allergies as of 10/09/2023       Reactions   Celecoxib Other (See Comments)   Penicillins Itching, Rash        Medication List     STOP taking these medications    diclofenac Sodium 1 % Gel Commonly known as: Voltaren   Eluxadoline 100 MG Tabs   hydrochlorothiazide 12.5 MG tablet Commonly known as: HYDRODIURIL   metoprolol-hydrochlorothiazide 100-25 MG tablet Commonly known as:  LOPRESSOR HCT   predniSONE 10 MG (21) Tbpk tablet Commonly known as: STERAPRED UNI-PAK 21 TAB       TAKE these medications    allopurinol 100 MG tablet Commonly known as: ZYLOPRIM Take 100 mg by mouth daily.   amLODipine 5 MG tablet Commonly known as: NORVASC Take 5 mg  by mouth daily.   aspirin 81 MG tablet Take 81 mg by mouth daily.   atorvastatin 10 MG tablet Commonly known as: LIPITOR Take 10 mg by mouth daily.   CALCIUM PO Take by mouth daily.   feeding supplement Liqd Take 237 mLs by mouth 2 (two) times daily between meals.   Fish Oil 1000 MG Cpdr Take by mouth daily.   levothyroxine 75 MCG tablet Commonly known as: SYNTHROID Take 75 mcg by mouth daily. What changed: Another medication with the same name was removed. Continue taking this medication, and follow the directions you see here.   omeprazole 40 MG capsule Commonly known as: PRILOSEC Take 40 mg by mouth daily.   triamcinolone ointment 0.1 % Commonly known as: KENALOG Apply 1 Application topically 2 (two) times daily. Back of neck   Vitamin D3 50 MCG (2000 UT) Tabs Take by mouth daily.        Discharge Exam: Filed Weights   10/06/23 1644  Weight: 72.6 kg   General - Elderly African American female, no apparent distress HEENT - PERRLA, EOMI, atraumatic head, non tender sinuses. Lung - Clear, no rales, rhonchi, wheezes. Heart - S1, S2 heard, no murmurs, rubs, trace pedal edema. Abdomen - Soft, non tender, bowel sounds good Neuro - Alert, awake and pleasantly confused, non focal exam. Skin - Warm and dry.  Condition at discharge: stable  The results of significant diagnostics from this hospitalization (including imaging, microbiology, ancillary and laboratory) are listed below for reference.   Imaging Studies: ECHOCARDIOGRAM COMPLETE  Result Date: 10/07/2023    ECHOCARDIOGRAM REPORT   Patient Name:   Shirley Knight Date of Exam: 10/07/2023 Medical Rec #:  [id number]       Height:       62.0 in Accession #:    [id number]      Weight:       160.0 lb Date of Birth:  1940/12/10       BSA:          1.739 m Patient Age:    82 years        BP:           126/97 mmHg Patient Gender: F               HR:           51 bpm. Exam Location:  [facility] Procedure: 2D Echo and Strain  Analysis Indications:     Atrial Fibrillation I48.91  History:         Patient has no prior history of Echocardiogram examinations.  Sonographer:     Overton Mam RDCS, FASE Referring Phys:  [id number] Antonieta Iba Diagnosing Phys: Julien Nordmann MD IMPRESSIONS  1. Left ventricular ejection fraction, by estimation, is 50 to 55%. The left ventricle has low normal function. The left ventricle has no regional wall motion abnormalities. Left ventricular diastolic parameters are consistent with Grade I diastolic dysfunction (impaired relaxation). The average left ventricular global longitudinal strain is -16.4 %.  2. Right ventricular systolic function is normal. The right ventricular size is normal. Tricuspid regurgitation signal is inadequate for assessing PA pressure.  3. The  mitral valve is normal in structure. Mild mitral valve regurgitation. No evidence of mitral stenosis.  4. The aortic valve is tricuspid. Aortic valve regurgitation is not visualized. No aortic stenosis is present.  5. The inferior vena cava is normal in size with greater than 50% respiratory variability, suggesting right atrial pressure of 3 mmHg. FINDINGS  Left Ventricle: Left ventricular ejection fraction, by estimation, is 50 to 55%. The left ventricle has low normal function. The left ventricle has no regional wall motion abnormalities. The average left ventricular global longitudinal strain is -16.4 %. The left ventricular internal cavity size was normal in size. There is no left ventricular hypertrophy. Left ventricular diastolic parameters are consistent with Grade I diastolic dysfunction (impaired relaxation). Right Ventricle: The right ventricular size is normal. No increase in right ventricular wall thickness. Right ventricular systolic function is normal. Tricuspid regurgitation signal is inadequate for assessing PA pressure. Left Atrium: Left atrial size was normal in size. Right Atrium: Right atrial size was normal in size.  Pericardium: There is no evidence of pericardial effusion. Mitral Valve: The mitral valve is normal in structure. Mild mitral valve regurgitation. No evidence of mitral valve stenosis. Tricuspid Valve: The tricuspid valve is normal in structure. Tricuspid valve regurgitation is mild . No evidence of tricuspid stenosis. Aortic Valve: The aortic valve is tricuspid. Aortic valve regurgitation is not visualized. No aortic stenosis is present. Aortic valve peak gradient measures 7.4 mmHg. Pulmonic Valve: The pulmonic valve was normal in structure. Pulmonic valve regurgitation is not visualized. No evidence of pulmonic stenosis. Aorta: The aortic root is normal in size and structure. Venous: The inferior vena cava is normal in size with greater than 50% respiratory variability, suggesting right atrial pressure of 3 mmHg. IAS/Shunts: No atrial level shunt detected by color flow Doppler.  LEFT VENTRICLE PLAX 2D LVIDd:         3.70 cm   Diastology LVIDs:         2.60 cm   LV e' medial:    7.83 cm/s LV PW:         0.90 cm   LV E/e' medial:  8.6 LV IVS:        0.90 cm   LV e' lateral:   8.59 cm/s LVOT diam:     1.80 cm   LV E/e' lateral: 7.8 LV SV:         63 LV SV Index:   36        2D Longitudinal Strain LVOT Area:     2.54 cm  2D Strain GLS Avg:     -16.4 %  RIGHT VENTRICLE RV Basal diam:  2.60 cm RV S prime:     13.70 cm/s TAPSE (M-mode): 2.1 cm LEFT ATRIUM             Index        RIGHT ATRIUM           Index LA diam:        2.70 cm 1.55 cm/m   RA Area:     10.50 cm LA Vol (A2C):   27.6 ml 15.87 ml/m  RA Volume:   20.80 ml  11.96 ml/m LA Vol (A4C):   17.3 ml 9.95 ml/m LA Biplane Vol: 22.7 ml 13.06 ml/m  AORTIC VALVE                 PULMONIC VALVE AV Area (Vmax): 1.91 cm     PV Vmax:  0.88 m/s AV Vmax:        136.00 cm/s  PV Peak grad:  3.1 mmHg AV Peak Grad:   7.4 mmHg LVOT Vmax:      102.00 cm/s LVOT Vmean:     65.500 cm/s LVOT VTI:       0.248 m  AORTA Ao Root diam: 2.60 cm MITRAL VALVE MV Area (PHT): 3.37  cm    SHUNTS MV Decel Time: 225 msec    Systemic VTI:  0.25 m MV E velocity: 67.10 cm/s  Systemic Diam: 1.80 cm MV A velocity: 83.30 cm/s MV E/A ratio:  0.81 Julien Nordmann MD Electronically signed by Julien Nordmann MD Signature Date/Time: 10/07/2023/6:54:51 PM    Final    MR BRAIN WO CONTRAST  Result Date: 10/07/2023 CLINICAL DATA:  Initial evaluation for syncope/presyncope, stroke suspected. EXAM: MRI HEAD WITHOUT CONTRAST TECHNIQUE: Multiplanar, multiecho pulse sequences of the brain and surrounding structures were obtained without intravenous contrast. COMPARISON:  Prior CT from 04/29/2022. FINDINGS: Brain: Mild prominence of the CSF containing spaces, compatible with age-related cerebral atrophy. Patchy T2/FLAIR hyperintensity involving the periventricular and deep white matter, consistent with chronic small vessel ischemic disease, moderately advanced in nature. Few scatter remote lacunar infarcts present about the bilateral basal ganglia, thalami, and pons. Tiny remote right cerebellar infarct noted. No evidence for acute or subacute ischemia. Gray-white matter differentiation maintained. No acute intracranial hemorrhage. Minimal chronic hemosiderin staining noted at the left temporoccipital junction. No mass lesion, midline shift or mass effect. No hydrocephalus. Cavum et septum pellucidum noted. No extra-axial fluid collection. w pituitary gland mildly prominent with convex border superiorly, but no visible discrete lesion. Vascular: Major intracranial vascular flow voids are maintained. Skull and upper cervical spine: Craniocervical junction within normal limits. Bone marrow signal intensity normal. No scalp soft tissue abnormality. Sinuses/Orbits: Prior bilateral ocular lens replacement. Paranasal sinuses are clear. Trace bilateral mastoid effusions noted, of doubtful significance. Other: None. IMPRESSION: 1. No acute intracranial abnormality. 2. Age-related cerebral atrophy with moderate chronic  microvascular ischemic disease, with a few scattered remote lacunar infarcts involving the bilateral basal ganglia, thalami, and pons. Electronically Signed   By: Rise Mu M.D.   On: 10/07/2023 01:21    Microbiology: Results for orders placed or performed in visit on 10/30/19  Novel Coronavirus, NAA ([facility])     Status: None   Collection Time: 10/30/19 12:00 AM   Specimen: Oropharyngeal(OP) collection in vial transport medium   OROPHARYNGEA  TESTING  Result Value Ref Range Status   SARS-CoV-2, NAA Not Detected Not Detected Final    Comment: This nucleic acid amplification test was developed and its performance characteristics determined by [employer]. Nucleic acid amplification tests include PCR and TMA. This test has not been FDA cleared or approved. This test has been authorized by FDA under an Emergency Use Authorization (EUA). This test is only authorized for the duration of time the declaration that circumstances exist justifying the authorization of the emergency use of in vitro diagnostic tests for detection of SARS-CoV-2 virus and/or diagnosis of COVID-19 infection under section 564(b)(1) of the Act, 21 U.S.C. 557DUK-0(U) (1), unless the authorization is terminated or revoked sooner. When diagnostic testing is negative, the possibility of a false negative result should be considered in the context of a patient's recent exposures and the presence of clinical signs and symptoms consistent with COVID-19. An individual without symptoms of COVID-19 and who is not shedding SARS-CoV-2 virus would  expect to have a negative (not detected) result in  this assay.     Labs: CBC: Recent Labs  Lab 10/06/23 1754 10/07/23 0608 10/08/23 0526  WBC 11.0* 9.2 8.6  HGB 12.2 10.1* 10.7*  HCT 37.2 30.6* 31.8*  MCV 92.1 91.3 88.1  PLT 240 222 237   Basic Metabolic Panel: Recent Labs  Lab 10/06/23 1754 10/06/23 2010 10/07/23 0608 10/08/23 0526  NA 132*  --   134* 133*  K 2.9*  --  3.6 4.0  CL 95*  --  101 103  CO2 23  --  22 19*  GLUCOSE 107*  --  83 87  BUN 18  --  16 15  CREATININE 1.18*  --  0.82 0.89  CALCIUM 9.5  --  8.5* 9.2  MG  --  1.8  --   --   PHOS  --  3.4  --   --    Liver Function Tests: Recent Labs  Lab 10/08/23 0526  AST 22  ALT 13  ALKPHOS 107  BILITOT 0.2  PROT 6.7  ALBUMIN 3.3*   CBG: No results for input(s): "GLUCAP" in the last 168 hours.  Discharge time spent: 37 minutes.  Signed: Marcelino Duster, MD [facility] 10/09/2023

## 2023-10-09 NOTE — Progress Notes (Signed)
Eeg done 

## 2023-10-09 NOTE — Care Management Obs Status (Signed)
MEDICARE OBSERVATION STATUS NOTIFICATION   Patient Details  Name: Shirley Knight MRN: [id number] Date of Birth: 04-03-1941   Medicare Observation Status Notification Given:  Yes    Chapman Fitch, RN 10/09/2023, 12:24 PM

## 2023-10-09 NOTE — Plan of Care (Signed)

## 2023-10-09 NOTE — TOC Initial Note (Signed)
Transition of Care [facility]) - Initial/Assessment Note    Patient Details  Name: Shirley Knight MRN: [id number] Date of Birth: [birth year]  [facility]) CM/SW Contact:    Chapman Fitch, RN Phone Number: 10/09/2023, 12:10 PM  Clinical Narrative:                  Admitted for: Near syncope  Admitted from: Home. Has PCS aides and family that rotate to provide care PCP: [facility] and sister provide transportation to appointments  Current home health/prior home health/DME: RW  Therapy recommending home health Spoke with nephew Earl Lites who will be transporting at discharge.  He is in agreement and states he does not have a preference of agency.  CMS Medicare.gov Compare Post Acute Care list reviewed with Earl Lites.  Referral made and accepted by Elnita Maxwell with [facility]    Expected Discharge Plan: Home w Home Health Services     Patient Goals and CMS Choice            Expected Discharge Plan and Services         Expected Discharge Date: 10/09/23                         HH Arranged: PT, OT HH Agency: [facility] Date [facility] Agency Contacted: 10/09/23   Representative spoke with at [facility] Agency: Elnita Maxwell  Prior Living Arrangements/Services   Lives with:: Self, Other (Comment)              Current home services: DME    Activities of Daily Living   ADL Screening (condition at time of admission) Independently performs ADLs?: Yes (appropriate for developmental age) Is the patient deaf or have difficulty hearing?: No Does the patient have difficulty seeing, even when wearing glasses/contacts?: No Does the patient have difficulty concentrating, remembering, or making decisions?: Yes  Permission Sought/Granted                  Emotional Assessment              Admission diagnosis:  Orthostatic hypotension [I95.1] Syncope [R55] Near syncope [R55] Patient Active Problem List   Diagnosis Date Noted   Orthostatic hypotension  10/07/2023   Bradycardia 10/07/2023   Weight loss 10/07/2023   Alcohol abuse 10/07/2023   Moderate dementia (HCC) 10/07/2023   Near syncope 10/06/2023   HTN (hypertension) 10/06/2023   Chronic kidney disease, stage 3a (HCC) 10/06/2023   Hypokalemia 10/06/2023   Leukocytosis 10/06/2023   Hypothyroidism    Hyperlipidemia    Gout    PCP:  Emogene Morgan, MD Pharmacy:   [facility] [phone] - [place] [place] - [address] [place] [address] Phone: [phone] Fax: [phone]  [id number] - [place], [place] - [address] [place] [address] Phone: [phone] Fax: [phone]     Social Determinants of Health (SDOH) Social History: SDOH Screenings   Food Insecurity: Patient Declined (10/07/2023)  Housing: Patient Declined (10/07/2023)  Transportation Needs: Patient Declined (10/07/2023)  Tobacco Use: High Risk (10/06/2023)   SDOH Interventions:     Readmission Risk Interventions     No data to display

## 2023-10-15 NOTE — Procedures (Signed)
Routine EEG Report  Shirley Knight is a 82 y.o. female with a history of syncope who is undergoing an EEG to evaluate for seizures.  Report: This EEG was acquired with electrodes placed according to the International 10-20 electrode system (including Fp1, Fp2, F3, F4, C3, C4, P3, P4, O1, O2, T3, T4, T5, T6, A1, A2, Fz, Cz, Pz). The following electrodes were missing or displaced: none.  The occipital dominant rhythm was 8.5 Hz. This activity is reactive to stimulation. Drowsiness was manifested by background fragmentation; deeper stages of sleep were identified by K complexes and sleep spindles. There was no focal slowing. There were no interictal epileptiform discharges. There were no electrographic seizures identified. There was no abnormal response to photic stimulation or hyperventilation.   Impression: This EEG was obtained while awake and asleep and is normal.    Clinical Correlation: Normal EEGs, however, do not rule out epilepsy.  Bing Neighbors, MD [facility] [phone]  If 7pm- 7am, please page neurology on call as listed in AMION.

## 2024-03-26 ENCOUNTER — Other Ambulatory Visit: Payer: Self-pay | Admitting: Family Medicine

## 2024-03-26 DIAGNOSIS — Z1231 Encounter for screening mammogram for malignant neoplasm of breast: Secondary | ICD-10-CM
